# Patient Record
Sex: Male | Born: 1950 | Race: White | Marital: Married | Smoking: Former smoker
Health system: Southern US, Community
[De-identification: ages and names within clinical notes are randomized; demographics above are authoritative.]

## PROBLEM LIST (undated history)

## (undated) DIAGNOSIS — K7581 Nonalcoholic steatohepatitis (NASH): Secondary | ICD-10-CM

## (undated) DIAGNOSIS — K635 Polyp of colon: Secondary | ICD-10-CM

## (undated) DIAGNOSIS — R918 Other nonspecific abnormal finding of lung field: Secondary | ICD-10-CM

## (undated) DIAGNOSIS — R161 Splenomegaly, not elsewhere classified: Secondary | ICD-10-CM

## (undated) DIAGNOSIS — D497 Neoplasm of unspecified behavior of endocrine glands and other parts of nervous system: Secondary | ICD-10-CM

## (undated) DIAGNOSIS — N529 Male erectile dysfunction, unspecified: Secondary | ICD-10-CM

## (undated) DIAGNOSIS — E785 Hyperlipidemia, unspecified: Secondary | ICD-10-CM

## (undated) DIAGNOSIS — I700 Atherosclerosis of aorta: Secondary | ICD-10-CM

## (undated) DIAGNOSIS — E119 Type 2 diabetes mellitus without complications: Secondary | ICD-10-CM

## (undated) DIAGNOSIS — I10 Essential (primary) hypertension: Secondary | ICD-10-CM

## (undated) HISTORY — DX: Neoplasm of unspecified behavior of endocrine glands and other parts of nervous system: D49.7

## (undated) HISTORY — PX: KNEE SURGERY: SHX244

## (undated) HISTORY — DX: Atherosclerosis of aorta: I70.0

## (undated) HISTORY — DX: Essential (primary) hypertension: I10

## (undated) HISTORY — DX: Splenomegaly, not elsewhere classified: R16.1

## (undated) HISTORY — PX: MELANOMA EXCISION: SHX5266

## (undated) HISTORY — DX: Other nonspecific abnormal finding of lung field: R91.8

## (undated) HISTORY — DX: Male erectile dysfunction, unspecified: N52.9

## (undated) HISTORY — DX: Hyperlipidemia, unspecified: E78.5

## (undated) HISTORY — DX: Nonalcoholic steatohepatitis (NASH): K75.81

## (undated) HISTORY — DX: Type 2 diabetes mellitus without complications: E11.9

## (undated) HISTORY — DX: Polyp of colon: K63.5

---

## 2019-10-07 ENCOUNTER — Other Ambulatory Visit: Payer: Self-pay | Admitting: Internal Medicine

## 2019-10-07 DIAGNOSIS — R053 Chronic cough: Secondary | ICD-10-CM

## 2019-10-07 DIAGNOSIS — Z87891 Personal history of nicotine dependence: Secondary | ICD-10-CM

## 2019-10-15 ENCOUNTER — Ambulatory Visit: Payer: Self-pay

## 2019-10-20 ENCOUNTER — Ambulatory Visit
Admission: RE | Admit: 2019-10-20 | Discharge: 2019-10-20 | Disposition: A | Discharge status: Home / Self Care | Payer: Medicare Other | Source: Ambulatory Visit | Attending: Internal Medicine | Admitting: Internal Medicine

## 2019-10-20 ENCOUNTER — Other Ambulatory Visit: Payer: Self-pay | Admitting: Internal Medicine

## 2019-10-20 DIAGNOSIS — Z87891 Personal history of nicotine dependence: Secondary | ICD-10-CM

## 2019-10-20 DIAGNOSIS — R053 Chronic cough: Secondary | ICD-10-CM

## 2019-10-23 ENCOUNTER — Other Ambulatory Visit: Payer: Self-pay | Admitting: Internal Medicine

## 2019-10-23 DIAGNOSIS — R918 Other nonspecific abnormal finding of lung field: Secondary | ICD-10-CM

## 2019-12-20 NOTE — Progress Notes (Signed)
Cardiology Office Note   Date:  12/22/2019   ID:  [name], DOB March 23, 1950, MRN [id number]  PCP:  Deland Pretty, MD  Cardiologist:   Tekisha Darcey Martinique, MD   Chief Complaint  Patient presents with   Coronary Artery Disease      History of Present Illness: Samuel Jimenez is a 69 y.o. male who is seen at the request of Dr Shelia Media for evaluation of coronary atherosclerosis noted on CT. The patient had a CT scan of the chest in September for evaluation of a cough. He is a former smoker. CT showed calcification of the aorta as well as the coronary arteries- particularly the left main and LAD distribution. He has a history of HTN, DM type 2,  and HLD.   He denies any prior cardiac disease. He states he was evaluated by a Cardiologist in [place] several years ago and had a stress test. He denies any chest pain, Dyspnea, palpitations, edema. He is very sedentary. Does only a little light yard work. Reports myalgias on statins in the past but can't remember what was tried. Was on Vascepa at one point but now just on Zetia. He has DM for > 15 years and has some neuropathy.    Past Medical History:  Diagnosis Date   Adrenal tumor    Aortic atherosclerosis ([place])    Colon polyp    Diabetes mellitus without complication ([place])    ED (erectile dysfunction)    Hyperlipidemia    Hypertension    NASH (nonalcoholic steatohepatitis)    Pulmonary nodules    Splenomegaly     Past Surgical History:  Procedure Laterality Date   KNEE SURGERY     MELANOMA EXCISION       Current Outpatient Medications  Medication Sig Dispense Refill   ALPRAZolam (XANAX) 0.5 MG tablet Take 0.5 mg by mouth 2 (two) times daily as needed.     amLODipine (NORVASC) 10 MG tablet Take 10 mg by mouth daily.     aspirin 81 MG EC tablet Take 81 mg by mouth daily. Swallow whole.     ezetimibe (ZETIA) 10 MG tablet Take 10 mg by mouth daily.     fluticasone (CUTIVATE) 0.05 % cream SMARTSIG:1 Topical Every  Night     ketoconazole (NIZORAL) 2 % cream SMARTSIG:1 Topical Every Night     losartan-hydrochlorothiazide (HYZAAR) 50-12.5 MG tablet Take 1 tablet by mouth daily.     LUMIGAN 0.01 % SOLN      metFORMIN (GLUCOPHAGE-XR) 500 MG 24 hr tablet Take 500 mg by mouth 3 (three) times daily.     OZEMPIC, 0.25 OR 0.5 MG/DOSE, 2 MG/1.5ML SOPN Inject into the skin.     zolpidem (AMBIEN) 10 MG tablet Take 10 mg by mouth at bedtime as needed.     rosuvastatin (CRESTOR) 5 MG tablet Take 1 tablet (5 mg total) by mouth daily. 90 tablet 3   No current facility-administered medications for this visit.    Allergies:   Patient has no allergy information on record.    Social History:  The patient  reports that he quit smoking about 4 years ago. He has never used smokeless tobacco. He reports previous alcohol use. He reports previous drug use.   Family History:  The patient's family history includes Heart attack in his father; Lung cancer in his mother.    ROS:  Please see the history of present illness.   Otherwise, review of systems are positive for none.   All  other systems are reviewed and negative.    PHYSICAL EXAM: VS:  BP 125/82    Pulse 64    Temp 98.1 F (36.7 C)    Ht 6' 4"  (1.93 m)    Wt 270 lb 6.4 oz (122.7 kg)    SpO2 96%    BMI 32.91 kg/m  , BMI Body mass index is 32.91 kg/m. GEN: Well nourished, overweight, in no acute distress  HEENT: normal  Neck: no JVD, carotid bruits, or masses Cardiac: RRR; no murmurs, rubs, or gallops,no edema. Pedal pulses are 2+.   Respiratory:  clear to auscultation bilaterally, normal work of breathing GI: soft, nontender, nondistended, + BS MS: no deformity or atrophy  Skin: warm and dry, no rash Neuro:  Strength and sensation are intact Psych: euthymic mood, full affect   EKG:  EKG is ordered today. The ekg ordered today demonstrates NSR rate 64. Incomplete RBBB. I have personally reviewed and interpreted this study.    Recent Labs: No results  found for requested labs within last 8760 hours.    Lipid Panel No results found for: CHOL, TRIG, HDL, CHOLHDL, VLDL, LDLCALC, LDLDIRECT   Dated 07/01/19: A1c 6.4% Dated 09/24/19: triglycerides 220, HDL 41, LDL 108. CBC normal. Glucose 155, Creatinine 1.0. otherwise CMET normal. a1c 6.9%   Wt Readings from Last 3 Encounters:  12/22/19 270 lb 6.4 oz (122.7 kg)      Other studies Reviewed: Additional studies/ records that were reviewed today include:   CT CHEST WITHOUT CONTRAST  TECHNIQUE: Multidetector CT imaging of the chest was performed following the standard protocol without IV contrast.  COMPARISON:  None  FINDINGS: Cardiovascular: Normal heart size. No pericardial effusion. Aortic atherosclerosis. Coronary artery calcifications.  Mediastinum/Nodes: No enlarged mediastinal or axillary lymph nodes. Thyroid gland, trachea, and esophagus demonstrate no significant findings.  Lungs/Pleura: No pleural effusion, airspace consolidation or atelectasis. Tiny right upper lobe lung nodule measures 3 mm, image 82/3. 5 mm right lower lobe lung nodule is identified, image 119/3. Faint nodule in the left lower lobe measures 3 mm, image 98/3. None  Upper Abdomen: The liver has a nodular contour and there is relative hypertrophy of the caudate and lateral segment of left lobe of liver. Imaging findings are consistent with cirrhosis. Right adrenal gland adenoma measures 0.8 cm.  Musculoskeletal: Multi level spondylosis identified within the thoracic spine. There are no acute or suspicious osseous findings identified. Chest  IMPRESSION: 1. No acute cardiopulmonary abnormalities. 2. Small nonspecific pulmonary nodules are identified measuring up to 5 mm. No follow-up needed if patient is low-risk (and has no known or suspected primary neoplasm). Non-contrast chest CT can be considered in 12 months if patient is high-risk. This recommendation follows the consensus statement:  Guidelines for Management of Incidental Pulmonary Nodules Detected on CT Images: From the Fleischner Society 2017; Radiology 2017; 284:228-243. 3. Coronary artery calcifications noted. 4. Morphologic features of the liver consistent with cirrhosis. 5. Right adrenal gland adenoma.  Aortic Atherosclerosis (ICD10-I70.0).   Electronically Signed   By: Kerby Moors M.D.   On: 10/20/2019 21:03    ASSESSMENT AND PLAN:  1. Coronary artery calcification. Noted incidentally on CT. Severe calcification of left main and LAD. No clear anginal symptoms but patient is very sedentary. Multiple cardiac risk factors. Recommend stress Myoview to make sure he doesn't have high risk anatomy. Continue ASA. Will initiate Crestor at 5 mg daily to see if he can tolerate. Stressed the importance of lifestyle modification with heart healthy diet, weight  control and regular aerobic exercise 5-7 days week with moderate exertion.  2. Aortic atherosclerosis. 3. HLD. On Zetia. Will try Crestor 5 mg daily. Titrate as tolerated. If unable to tolerate will need to consider a PCSK 9 inhibitor.  4. HTN controlled 5. 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 Right adrenal adenoma.  43. Former smoker. 9. Family history of CAD.    Current medicines are reviewed at length with the patient today.  The patient does not have concerns regarding medicines.  The following changes have been made:  See above  Labs/ tests ordered today include:   Orders Placed This Encounter  Procedures   Cardiac Stress Test: Informed Consent Details: Physician/Practitioner Attestation; Transcribe to consent form and obtain patient signature   Myocardial Perfusion Imaging   EKG 12-Lead     Disposition:   FU with me post Myoview  Signed, Chamya Hunton Martinique, MD  12/22/2019 11:52 AM    [facility] [address], [place], [place], [address] Phone [phone], Fax [phone]

## 2019-12-22 ENCOUNTER — Ambulatory Visit (INDEPENDENT_AMBULATORY_CARE_PROVIDER_SITE_OTHER): Payer: Medicare Other | Admitting: Cardiology

## 2019-12-22 ENCOUNTER — Other Ambulatory Visit: Payer: Self-pay

## 2019-12-22 ENCOUNTER — Encounter: Payer: Self-pay | Admitting: Cardiology

## 2019-12-22 VITALS — BP 125/82 | HR 64 | Temp 98.1°F | Ht 76.0 in | Wt 270.4 lb

## 2019-12-22 DIAGNOSIS — E118 Type 2 diabetes mellitus with unspecified complications: Secondary | ICD-10-CM

## 2019-12-22 DIAGNOSIS — E782 Mixed hyperlipidemia: Secondary | ICD-10-CM | POA: Diagnosis not present

## 2019-12-22 DIAGNOSIS — I10 Essential (primary) hypertension: Secondary | ICD-10-CM

## 2019-12-22 DIAGNOSIS — I2584 Coronary atherosclerosis due to calcified coronary lesion: Secondary | ICD-10-CM | POA: Diagnosis not present

## 2019-12-22 DIAGNOSIS — I2510 Atherosclerotic heart disease of native coronary artery without angina pectoris: Secondary | ICD-10-CM | POA: Diagnosis not present

## 2019-12-22 MED ORDER — ROSUVASTATIN CALCIUM 5 MG PO TABS: 5.0000 mg | ORAL_TABLET | Freq: Every day | ORAL | 3 refills | Status: DC

## 2019-12-22 NOTE — Patient Instructions (Addendum)
Start Crestor 5 mg daily  We will arrange for a Nuclear stress test ( Stress Myoview )

## 2020-01-06 ENCOUNTER — Other Ambulatory Visit (HOSPITAL_COMMUNITY)
Admission: RE | Admit: 2020-01-06 | Discharge: 2020-01-06 | Disposition: A | Discharge status: Home / Self Care | Payer: Medicare Other | Source: Ambulatory Visit | Attending: Cardiology | Admitting: Cardiology

## 2020-01-06 DIAGNOSIS — Z01812 Encounter for preprocedural laboratory examination: Secondary | ICD-10-CM | POA: Diagnosis present

## 2020-01-06 DIAGNOSIS — Z20822 Contact with and (suspected) exposure to covid-19: Secondary | ICD-10-CM | POA: Insufficient documentation

## 2020-01-06 LAB — SARS CORONAVIRUS 2 (TAT 6-24 HRS): SARS Coronavirus 2: NEGATIVE

## 2020-01-09 ENCOUNTER — Ambulatory Visit (HOSPITAL_COMMUNITY)
Admission: RE | Admit: 2020-01-09 | Discharge: 2020-01-09 | Disposition: A | Discharge status: Home / Self Care | Payer: Medicare Other | Source: Ambulatory Visit | Attending: Cardiovascular Disease | Admitting: Cardiovascular Disease

## 2020-01-09 ENCOUNTER — Other Ambulatory Visit: Payer: Self-pay

## 2020-01-09 DIAGNOSIS — I2584 Coronary atherosclerosis due to calcified coronary lesion: Secondary | ICD-10-CM | POA: Diagnosis present

## 2020-01-09 DIAGNOSIS — I2510 Atherosclerotic heart disease of native coronary artery without angina pectoris: Secondary | ICD-10-CM | POA: Diagnosis present

## 2020-01-09 LAB — MYOCARDIAL PERFUSION IMAGING
Estimated workload: 9.6 METS
Exercise duration (min): 8 min
Exercise duration (sec): 0 s
LV dias vol: 107 mL (ref 62–150)
LV sys vol: 53 mL
MPHR: 153 {beats}/min
Peak HR: 162 {beats}/min
Percent HR: 105 %
Rest HR: 57 {beats}/min
SDS: 1
SRS: 1
SSS: 2
TID: 0.8

## 2020-01-09 MED ORDER — TECHNETIUM TC 99M TETROFOSMIN IV KIT
29.8000 | PACK | Freq: Once | INTRAVENOUS | Status: AC | PRN
  Administered 2020-01-09: 29.8 via INTRAVENOUS
  Filled 2020-01-09: qty 30

## 2020-01-09 MED ORDER — TECHNETIUM TC 99M TETROFOSMIN IV KIT
10.2000 | PACK | Freq: Once | INTRAVENOUS | Status: AC | PRN
  Administered 2020-01-09: 10.2 via INTRAVENOUS
  Filled 2020-01-09: qty 11

## 2020-01-15 ENCOUNTER — Other Ambulatory Visit: Payer: Self-pay

## 2020-01-15 DIAGNOSIS — E782 Mixed hyperlipidemia: Secondary | ICD-10-CM

## 2020-01-15 DIAGNOSIS — I2510 Atherosclerotic heart disease of native coronary artery without angina pectoris: Secondary | ICD-10-CM

## 2020-01-15 NOTE — Progress Notes (Signed)
Spoke to patient stress myoview results given.Dr.Jordan advised risk factor modifications.Advised to have fasting lipid and hepatic panels in 3 months.Lab orders mailed.

## 2020-04-05 LAB — LIPID PANEL
Chol/HDL Ratio: 2.7 ratio (ref 0.0–5.0)
Cholesterol, Total: 120 mg/dL (ref 100–199)
HDL: 45 mg/dL (ref >39)
LDL Chol Calc (NIH): 49 mg/dL (ref 0–99)
Triglycerides: 155 mg/dL — ABNORMAL HIGH (ref 0–149)
VLDL Cholesterol Cal: 26 mg/dL (ref 5–40)

## 2020-04-05 LAB — HEPATIC FUNCTION PANEL
ALT: 29 IU/L (ref 0–44)
AST: 27 IU/L (ref 0–40)
Albumin: 4.7 g/dL (ref 3.8–4.8)
Alkaline Phosphatase: 28 IU/L — ABNORMAL LOW (ref 44–121)
Bilirubin Total: 0.5 mg/dL (ref 0.0–1.2)
Bilirubin, Direct: 0.15 mg/dL (ref 0.00–0.40)
Total Protein: 7.2 g/dL (ref 6.0–8.5)

## 2020-04-10 NOTE — Progress Notes (Unsigned)
Cardiology Office Note   Date:  04/14/2020   ID:  Samuel Jimenez, DOB [birth year], MRN [id number]  PCP:  Deland Pretty, MD  Cardiologist:   Peter Martinique, MD   Chief Complaint  Patient presents with  . Coronary Artery Disease      History of Present Illness: Samuel Jimenez is a 70 y.o. male who is seen at the request of Dr Shelia Media for evaluation of coronary atherosclerosis noted on CT. The patient had a CT scan of the chest in September for evaluation of a cough. He is a former smoker. CT showed calcification of the aorta as well as the coronary arteries- particularly the left main and LAD distribution. He has a history of HTN, DM type 2,  and HLD. Myoview done 01/09/20 was normal.   He denies any prior cardiac disease. He states he was evaluated by a Cardiologist in [place] several years ago and had a stress test. He denies any chest pain, Dyspnea, palpitations, edema. He is very sedentary. Does only a little light yard work. Reports myalgias on statins in the past but can't remember what was tried. Was on Vascepa at one point but now just on Zetia. He has DM for > 15 years and has some neuropathy.  On his last visit we tried him on Crestor 5 mg daily. He said the next day he developed myalgias and generalized body aches that are horrific. Last LDL 84 still is not at goal. Reports CK level checked by primary care was normal.     Past Medical History:  Diagnosis Date  . Adrenal tumor   . Aortic atherosclerosis ([place])   . Colon polyp   . Diabetes mellitus without complication ([place])   . ED (erectile dysfunction)   . Hyperlipidemia   . Hypertension   . NASH (nonalcoholic steatohepatitis)   . Pulmonary nodules   . Splenomegaly     Past Surgical History:  Procedure Laterality Date  . KNEE SURGERY    . MELANOMA EXCISION       Current Outpatient Medications  Medication Sig Dispense Refill  . ALPRAZolam (XANAX) 0.5 MG tablet Take 0.5 mg by mouth 2 (two) times daily as needed.     Marland Kitchen amLODipine (NORVASC) 10 MG tablet Take 10 mg by mouth daily.    Marland Kitchen aspirin 81 MG EC tablet Take 81 mg by mouth daily. Swallow whole.    . ezetimibe (ZETIA) 10 MG tablet Take 10 mg by mouth daily.    . fluticasone (CUTIVATE) 0.05 % cream SMARTSIG:1 Topical Every Night    . ketoconazole (NIZORAL) 2 % cream SMARTSIG:1 Topical Every Night    . losartan-hydrochlorothiazide (HYZAAR) 50-12.5 MG tablet Take 1 tablet by mouth daily.    Marland Kitchen LUMIGAN 0.01 % SOLN     . metFORMIN (GLUCOPHAGE-XR) 500 MG 24 hr tablet Take 500 mg by mouth 3 (three) times daily.    Marland Kitchen OZEMPIC, 0.25 OR 0.5 MG/DOSE, 2 MG/1.5ML SOPN Inject into the skin.    Marland Kitchen zolpidem (AMBIEN) 10 MG tablet Take 10 mg by mouth at bedtime as needed.     No current facility-administered medications for this visit.    Allergies:   Patient has no allergy information on record.    Social History:  The patient  reports that he quit smoking about 4 years ago. He has never used smokeless tobacco. He reports previous alcohol use. He reports previous drug use.   Family History:  The patient's family history includes Heart attack in  his father; Lung cancer in his mother.    ROS:  Please see the history of present illness.   Otherwise, review of systems are positive for none.   All other systems are reviewed and negative.    PHYSICAL EXAM: VS:  BP 135/82   Pulse 93   Ht 6' 4"  (1.93 m)   Wt 256 lb 9.6 oz (116.4 kg)   SpO2 95%   BMI 31.23 kg/m  , BMI Body mass index is 31.23 kg/m. GEN: Well nourished, overweight, in no acute distress  HEENT: normal  Neck: no JVD, carotid bruits, or masses Cardiac: RRR; no murmurs, rubs, or gallops,no edema. Pedal pulses are 2+.   Respiratory:  clear to auscultation bilaterally, normal work of breathing GI: soft, nontender, nondistended, + BS MS: no deformity or atrophy  Skin: warm and dry, no rash Neuro:  Strength and sensation are intact Psych: euthymic mood, full affect   EKG:  EKG is not ordered  today.  Recent Labs: 04/05/2020: ALT 29    Lipid Panel    Component Value Date/Time   CHOL 120 04/05/2020 0935   TRIG 155 (H) 04/05/2020 0935   HDL 45 04/05/2020 0935   CHOLHDL 2.7 04/05/2020 0935   LDLCALC 49 04/05/2020 0935     Dated 07/01/19: A1c 6.4% Dated 09/24/19: triglycerides 220, HDL 41, LDL 108. CBC normal. Glucose 155, Creatinine 1.0. otherwise CMET normal. a1c 6.9%  Dated 03/24/20: CBC normal. Cholesterol 169, triglycerides 217, HDL 42, LDL 84. A1c 6.4%. CMET normal. PSA normal.   Wt Readings from Last 3 Encounters:  04/14/20 256 lb 9.6 oz (116.4 kg)  01/09/20 270 lb (122.5 kg)  12/22/19 270 lb 6.4 oz (122.7 kg)      Other studies Reviewed: Additional studies/ records that were reviewed today include:   CT CHEST WITHOUT CONTRAST  TECHNIQUE: Multidetector CT imaging of the chest was performed following the standard protocol without IV contrast.  COMPARISON:  None  FINDINGS: Cardiovascular: Normal heart size. No pericardial effusion. Aortic atherosclerosis. Coronary artery calcifications.  Mediastinum/Nodes: No enlarged mediastinal or axillary lymph nodes. Thyroid gland, trachea, and esophagus demonstrate no significant findings.  Lungs/Pleura: No pleural effusion, airspace consolidation or atelectasis. Tiny right upper lobe lung nodule measures 3 mm, image 82/3. 5 mm right lower lobe lung nodule is identified, image 119/3. Faint nodule in the left lower lobe measures 3 mm, image 98/3. None  Upper Abdomen: The liver has a nodular contour and there is relative hypertrophy of the caudate and lateral segment of left lobe of liver. Imaging findings are consistent with cirrhosis. Right adrenal gland adenoma measures 0.8 cm.  Musculoskeletal: Multi level spondylosis identified within the thoracic spine. There are no acute or suspicious osseous findings identified. Chest  IMPRESSION: 1. No acute cardiopulmonary abnormalities. 2. Small nonspecific  pulmonary nodules are identified measuring up to 5 mm. No follow-up needed if patient is low-risk (and has no known or suspected primary neoplasm). Non-contrast chest CT can be considered in 12 months if patient is high-risk. This recommendation follows the consensus statement: Guidelines for Management of Incidental Pulmonary Nodules Detected on CT Images: From the Fleischner Society 2017; Radiology 2017; 284:228-243. 3. Coronary artery calcifications noted. 4. Morphologic features of the liver consistent with cirrhosis. 5. Right adrenal gland adenoma.  Aortic Atherosclerosis (ICD10-I70.0).   Electronically Signed   By: Kerby Moors M.D.   On: 10/20/2019 21:03  Myoview 01/09/20: Study Highlights    Nuclear stress EF: 51%.  The left ventricular ejection fraction is  mildly decreased (45-54%).  Blood pressure demonstrated a hypertensive response to exercise.  There was no ST segment deviation noted during stress.  The study is normal.  This is a low risk study.   Normal resting and stress perfusion. No ischemia or infarction EF 51% but appears normal Baseline ECG RBBB HTN response to exercise Normal ETT with no ischemia    ASSESSMENT AND PLAN:  1. Coronary artery calcification. Noted incidentally on CT. Severe calcification of left main and LAD. No clear anginal symptoms but patient is very sedentary. Multiple cardiac risk factors. Stress Myoview was fortunately normal. Focus on risk factor modification. Encouraged increase aerobic activity. 2. Aortic atherosclerosis. 3. HLD. On Zetia. Intolerant of low dose Crestor 5 mg daily. Recommend he discontinue this. Will refer to lipid clinic to consider a PCSK 9 inhibitor vs Nexlitol.  4. HTN controlled 5. 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 Right adrenal adenoma.  8. Former smoker. 9. Family history of CAD.  Current medicines are reviewed at length with the patient today.  The patient does  not have concerns regarding medicines.  The following changes have been made:  See above  Labs/ tests ordered today include:   No orders of the defined types were placed in this encounter.    Disposition:   FU with me 6 months   Signed, Peter Martinique, MD  04/14/2020 9:56 AM    [facility] [address], [place], [place], [address] Phone [phone], Fax [phone]

## 2020-04-14 ENCOUNTER — Other Ambulatory Visit: Payer: Self-pay

## 2020-04-14 ENCOUNTER — Ambulatory Visit (INDEPENDENT_AMBULATORY_CARE_PROVIDER_SITE_OTHER): Payer: Medicare Other | Admitting: Cardiology

## 2020-04-14 ENCOUNTER — Encounter: Payer: Self-pay | Admitting: Cardiology

## 2020-04-14 VITALS — BP 135/82 | HR 93 | Ht 76.0 in | Wt 256.6 lb

## 2020-04-14 DIAGNOSIS — I10 Essential (primary) hypertension: Secondary | ICD-10-CM | POA: Diagnosis not present

## 2020-04-14 DIAGNOSIS — I2510 Atherosclerotic heart disease of native coronary artery without angina pectoris: Secondary | ICD-10-CM | POA: Diagnosis not present

## 2020-04-14 DIAGNOSIS — I2584 Coronary atherosclerosis due to calcified coronary lesion: Secondary | ICD-10-CM

## 2020-04-14 DIAGNOSIS — E118 Type 2 diabetes mellitus with unspecified complications: Secondary | ICD-10-CM | POA: Diagnosis not present

## 2020-04-14 DIAGNOSIS — E782 Mixed hyperlipidemia: Secondary | ICD-10-CM | POA: Diagnosis not present

## 2020-04-14 NOTE — Patient Instructions (Signed)
Medication Instructions:  Continue same medications *If you need a refill on your cardiac medications before your next appointment, please call your pharmacy*   Lab Work: None ordered   Testing/Procedures: None ordered   Follow-Up: At [facility], you and your health needs are our priority.  As part of our continuing mission to provide you with exceptional heart care, we have created designated Provider Care Teams.  These Care Teams include your primary Cardiologist (physician) and Advanced Practice Providers (APPs -  Physician Assistants and Nurse Practitioners) who all work together to provide you with the care you need, when you need it.  We recommend signing up for the patient portal called "MyChart".  Sign up information is provided on this After Visit Summary.  MyChart is used to connect with patients for Virtual Visits (Telemedicine).  Patients are able to view lab/test results, encounter notes, upcoming appointments, etc.  Non-urgent messages can be sent to your provider as well.   To learn more about what you can do with MyChart, go to [email or web address].    Your next appointment: 6 months   Call in July to schedule Sept appointment     The format for your next appointment:  Office     Provider: Dr.Jordan   Schedule appointment with [facility]

## 2020-05-11 ENCOUNTER — Other Ambulatory Visit: Payer: Self-pay

## 2020-05-11 ENCOUNTER — Ambulatory Visit (INDEPENDENT_AMBULATORY_CARE_PROVIDER_SITE_OTHER): Payer: Medicare Other | Admitting: Pharmacist

## 2020-05-11 VITALS — BP 152/84 | HR 73 | Resp 16 | Ht 76.0 in | Wt 269.2 lb

## 2020-05-11 DIAGNOSIS — G720 Drug-induced myopathy: Secondary | ICD-10-CM | POA: Diagnosis not present

## 2020-05-11 DIAGNOSIS — E785 Hyperlipidemia, unspecified: Secondary | ICD-10-CM | POA: Diagnosis not present

## 2020-05-11 DIAGNOSIS — K7581 Nonalcoholic steatohepatitis (NASH): Secondary | ICD-10-CM

## 2020-05-11 DIAGNOSIS — Z79899 Other long term (current) drug therapy: Secondary | ICD-10-CM | POA: Diagnosis not present

## 2020-05-11 DIAGNOSIS — I2510 Atherosclerotic heart disease of native coronary artery without angina pectoris: Secondary | ICD-10-CM

## 2020-05-11 DIAGNOSIS — T466X5A Adverse effect of antihyperlipidemic and antiarteriosclerotic drugs, initial encounter: Secondary | ICD-10-CM

## 2020-05-11 DIAGNOSIS — I2584 Coronary atherosclerosis due to calcified coronary lesion: Secondary | ICD-10-CM

## 2020-05-11 NOTE — Patient Instructions (Addendum)
Your Results:             Your most recent labs Goal  Total Cholesterol 120 < 200  Triglycerides 155 < 150  HDL (good cholesterol) 45 > 40  LDL (bad cholesterol) 49 < 70      Medication changes: *Continue taking ezetimibe 83m daily  *Other Cholesterol management options:   PCSK9i injections (Repatha/Praluent) - will determine need after fasting blood work completed    Clinic phone number: [phone](Caeson Filippi/Kristin/Haliegh)  Lab orders:  Repeat fasting in 7-10 days for baseline results  Patient Assistance:  The [employer] foundation offers assistance to help pay for medication copays.  They will cover copays for all cholesterol lowering meds, including statins, fibrates, omega-3 oils, ezetimibe, Repatha, Praluent, Nexletol, Nexlizet.  The cards are usually good for $2,500 or 12 months, whichever comes first. 1. Go to healthwellfoundation.org 2. Click on "Apply Now" 3. Answer questions as to whom is applying (patient or representative) 4. Your disease fund will be "hypercholesterolemia - Medicare access" 5. They will ask questions about finances and which medications you are taking for cholesterol 6. When you submit, the approval is usually within minutes.  You will need to print the card information from the site 7. You will need to show this information to your pharmacy, they will bill your Medicare Part D plan first -then bill [employer] Well --for the copay.   You can also call them at [phone] although the hold times can be quite long.   Thank you for choosing [facility]

## 2020-05-11 NOTE — Progress Notes (Signed)
Patient ID: Samuel Jimenez                 DOB: 13-Aug-1950                    MRN: [id number]     HPI: Samuel Jimenez is a 70 y.o. male patient referred to lipid clinic by Dr. Martinique. PMH is significant for hypertension, diabetes, hyperlipidemia, NASH (diagnosed 4-5 years ago), splenomegaly, family hx of CAD, and aortic calcification per CT reports (aortic atherosclerosis). Chronic back and legs pain worsen with statin.   Current Medications: Ezetimibe 68m daily   Intolerances:  Atorvastatin - per history - severe muscle pain Rosuvastatin 575mdaily - severe muscle pain Vascepa - ??  LDL goal: <7022mL  Diet: cut out all alcohol, eat more vegetables, decreased fat as much as possible, mostly home cooked meals.  Exercise: activities of daily living, light yard work, 1 mile walks evert 2-3 days  Family History: family history includes Heart attack in his father; Lung cancer in his mother.   Social History: he quit smoking about 4 years ago. He has never used smokeless tobacco. He reports previous alcohol use. He reports previous drug use.  Labs: 04/05/2020: CHO 120, TG 155, HDL 45, LDL-c 49 (rosuvastatin 5mg26mus ezetimibe 10mg77mast Medical History:  Diagnosis Date  . Adrenal tumor   . Aortic atherosclerosis ([facility]) [place] Colon polyp   . Diabetes mellitus without complication ([facility]) [place] ED (erectile dysfunction)   . Hyperlipidemia   . Hypertension   . NASH (nonalcoholic steatohepatitis)   . Pulmonary nodules   . Splenomegaly     Current Outpatient Medications on File Prior to Visit  Medication Sig Dispense Refill  . ALPRAZolam (XANAX) 0.5 MG tablet Take 0.5 mg by mouth 2 (two) times daily as needed.    . amLMarland KitchenDipine (NORVASC) 10 MG tablet Take 10 mg by mouth daily.    . aspMarland Kitchenrin 81 MG EC tablet Take 81 mg by mouth daily. Swallow whole.    . ezetimibe (ZETIA) 10 MG tablet Take 10 mg by mouth daily.    . losMarland Kitchenrtan-hydrochlorothiazide (HYZAAR) 50-12.5 MG tablet Take 1 tablet by mouth  daily.    . LUMMarland KitchenGAN 0.01 % SOLN     . metFORMIN (GLUCOPHAGE-XR) 500 MG 24 hr tablet Take 500 mg by mouth 3 (three) times daily.    . Multiple Vitamins-Minerals (MULTIVITAMIN WITH MINERALS) tablet Take 1 tablet by mouth daily.    . OZEMarland KitchenPIC, 0.25 OR 0.5 MG/DOSE, 2 MG/1.5ML SOPN Inject into the skin.    . timMarland Kitchenlol (BETIMOL) 0.25 % ophthalmic solution 1-2 drops 2 (two) times daily.    . vitamin E 1000 UNIT capsule Take 1,000 Units by mouth daily.    . zolMarland Kitchenidem (AMBIEN) 10 MG tablet Take 10 mg by mouth at bedtime as needed.    . fluticasone (CUTIVATE) 0.05 % cream SMARTSIG:1 Topical Every Night    . ketoconazole (NIZORAL) 2 % cream SMARTSIG:1 Topical Every Night     No current facility-administered medications on file prior to visit.    Allergies  Allergen Reactions  . Crestor [Rosuvastatin]     myalgias  . Lipitor [Atorvastatin]     myalgias    Hyperlipidemia LDL goal <70 Most recent LDL at desired goal for secondary prevention but patient stopped rosuvastatin therapy due to increase back and leg pain. Noted history of NASH and diabetes as well. Patient remains on ezetimibe 10mg 59mtherapy with no problems. He stop  all alcohol and decreased fatty food intake as well. Exercises as much as possible , but suffers from chronic back and leg pain.   We discussed Repatha/Praluent therapy including MOA, prior-authorization process, administration storage, common side effects, morning, and financial assistance available.  Will repeat fasting blood work in 7-10 days (4 weeks after stopping rosuvastatin). Plan to start PA process for Repatha/Praluent if LDL above 17m/dL.   Korie Brabson Rodriguez-Guzman PharmD, BCPS, CPort [id number]/18/2022 4:50 PM

## 2020-05-17 ENCOUNTER — Encounter: Payer: Self-pay | Admitting: Pharmacist

## 2020-05-17 DIAGNOSIS — E785 Hyperlipidemia, unspecified: Secondary | ICD-10-CM | POA: Insufficient documentation

## 2020-05-17 DIAGNOSIS — T466X5A Adverse effect of antihyperlipidemic and antiarteriosclerotic drugs, initial encounter: Secondary | ICD-10-CM | POA: Insufficient documentation

## 2020-05-17 DIAGNOSIS — G720 Drug-induced myopathy: Secondary | ICD-10-CM | POA: Insufficient documentation

## 2020-05-17 DIAGNOSIS — K7581 Nonalcoholic steatohepatitis (NASH): Secondary | ICD-10-CM | POA: Insufficient documentation

## 2020-05-17 NOTE — Assessment & Plan Note (Signed)
Most recent LDL at desired goal for secondary prevention but patient stopped rosuvastatin therapy due to increase back and leg pain. Noted history of NASH and diabetes as well. Patient remains on ezetimibe 30m monotherapy with no problems. He stop all alcohol and decreased fatty food intake as well. Exercises as much as possible , but suffers from chronic back and leg pain.   We discussed Repatha/Praluent therapy including MOA, prior-authorization process, administration storage, common side effects, morning, and financial assistance available.  Will repeat fasting blood work in 7-10 days (4 weeks after stopping rosuvastatin). Plan to start PA process for Repatha/Praluent if LDL above 781mdL.

## 2020-05-20 ENCOUNTER — Telehealth: Payer: Self-pay

## 2020-05-20 DIAGNOSIS — E785 Hyperlipidemia, unspecified: Secondary | ICD-10-CM

## 2020-05-20 LAB — LIPID PANEL
Chol/HDL Ratio: 5.4 ratio — ABNORMAL HIGH (ref 0.0–5.0)
Cholesterol, Total: 193 mg/dL (ref 100–199)
HDL: 36 mg/dL — ABNORMAL LOW (ref >39)
LDL Chol Calc (NIH): 110 mg/dL — ABNORMAL HIGH (ref 0–99)
Triglycerides: 273 mg/dL — ABNORMAL HIGH (ref 0–149)
VLDL Cholesterol Cal: 47 mg/dL — ABNORMAL HIGH (ref 5–40)

## 2020-05-20 MED ORDER — REPATHA SURECLICK 140 MG/ML ~~LOC~~ SOAJ: 140.0000 mg | SUBCUTANEOUS | 11 refills | Status: DC

## 2020-05-20 MED ORDER — PRALUENT 150 MG/ML ~~LOC~~ SOAJ: 150.0000 mg | SUBCUTANEOUS | 11 refills | Status: DC

## 2020-05-20 NOTE — Telephone Encounter (Signed)
Called and spoke w/pt regarding the approval of the praluent, rx sent and pt voiced understanding to complete fasting labs after 4th dose and no appointment needed. healthwell information emailed to pt and called into the pharmacy. Pt voiced gratitude and understanding

## 2020-05-21 ENCOUNTER — Telehealth: Payer: Self-pay

## 2020-05-21 NOTE — Telephone Encounter (Signed)
Transition Care Management Unsuccessful Follow-up Telephone Call  Date of discharge and from where:  05/20/2020 from [facility]  Attempts:  1st Attempt  Reason for unsuccessful TCM follow-up call:  Left voice message

## 2020-05-24 NOTE — Telephone Encounter (Signed)
Transition Care Management Follow-up Telephone Call  Date of discharge and from where: 05/20/2020 from [facility]  How have you been since you were released from the hospital? Pt states that he is feeling well.   Any questions or concerns? No  Items Reviewed:  Did the pt receive and understand the discharge instructions provided? Yes   Medications obtained and verified? Yes   Other? No   Any new allergies since your discharge? No   Dietary orders reviewed? N/a  Do you have support at home? Yes   Functional Questionnaire: (I = Independent and D = Dependent) ADLs: I  Bathing/Dressing- I  Meal Prep- I  Eating- I  Maintaining continence- I  Transferring/Ambulation- I  Managing Meds- I   Follow up appointments reviewed:   PCP Hospital f/u appt confirmed? No    Specialist Hospital f/u appt confirmed? No  .  Are transportation arrangements needed? No   If their condition worsens, is the pt aware to call PCP or go to the Emergency Dept.? Yes  Was the patient provided with contact information for the PCP's office or ED? Yes  Was to pt encouraged to call back with questions or concerns? Yes

## 2020-07-09 LAB — HEPATIC FUNCTION PANEL
ALT: 30 IU/L (ref 0–44)
AST: 26 IU/L (ref 0–40)
Albumin: 4.3 g/dL (ref 3.8–4.8)
Alkaline Phosphatase: 32 IU/L — ABNORMAL LOW (ref 44–121)
Bilirubin Total: 0.4 mg/dL (ref 0.0–1.2)
Bilirubin, Direct: 0.15 mg/dL (ref 0.00–0.40)
Total Protein: 7 g/dL (ref 6.0–8.5)

## 2020-07-09 LAB — LIPID PANEL
Chol/HDL Ratio: 2.8 ratio (ref 0.0–5.0)
Cholesterol, Total: 121 mg/dL (ref 100–199)
HDL: 43 mg/dL (ref >39)
LDL Chol Calc (NIH): 40 mg/dL (ref 0–99)
Triglycerides: 247 mg/dL — ABNORMAL HIGH (ref 0–149)
VLDL Cholesterol Cal: 38 mg/dL (ref 5–40)

## 2020-07-12 NOTE — Telephone Encounter (Signed)
Liver function test remain within normal limits. LDL (bad cholesterol) great at 86m/dL.  Triglycerides continue to be elevated. Keep simple sugars, pasta, bread and beer to minimum.  Will benefit from adding Vascepa or Lovaza to current therapy.

## 2020-07-12 NOTE — Addendum Note (Signed)
Addended by: Allean Found on: 07/12/2020 08:54 AM   Modules accepted: Orders

## 2020-07-12 NOTE — Telephone Encounter (Signed)
Called and spoke w/pt and results given to pt and they stated that they would like to make the changes in their diet first and re-evaluate in 3 months and they voiced understanding

## 2020-09-30 ENCOUNTER — Other Ambulatory Visit: Payer: Self-pay | Admitting: Internal Medicine

## 2020-09-30 DIAGNOSIS — R918 Other nonspecific abnormal finding of lung field: Secondary | ICD-10-CM

## 2020-10-29 ENCOUNTER — Ambulatory Visit: Payer: Medicare Other | Admitting: Cardiology

## 2020-11-04 ENCOUNTER — Other Ambulatory Visit: Payer: Self-pay

## 2020-11-04 ENCOUNTER — Ambulatory Visit
Admission: RE | Admit: 2020-11-04 | Discharge: 2020-11-04 | Disposition: A | Discharge status: Home / Self Care | Payer: Medicare Other | Source: Ambulatory Visit | Attending: Internal Medicine | Admitting: Internal Medicine

## 2020-11-04 DIAGNOSIS — R918 Other nonspecific abnormal finding of lung field: Secondary | ICD-10-CM

## 2021-03-10 NOTE — Progress Notes (Signed)
Cardiology Office Note   Date:  03/14/2021   ID:  Samuel Jimenez, DOB [birth year], MRN [id number]  PCP:  Deland Pretty, MD  Cardiologist:   Ndea Kilroy Martinique, MD   Chief Complaint  Patient presents with   Coronary Artery Disease      History of Present Illness: Samuel Jimenez is a 71 y.o. male who is seen at the request of Dr Shelia Media for evaluation of coronary atherosclerosis noted on CT. The patient had a CT scan of the chest in September for evaluation of a cough. He is a former smoker. CT showed calcification of the aorta as well as the coronary arteries- particularly the left main and LAD distribution. He has a history of HTN, DM type 2,  and HLD. Myoview done 01/09/20 was normal.   He denies any prior cardiac disease. He states he was evaluated by a Cardiologist in [place] several years ago and had a stress test.   On follow up today he denies any chest pain, dyspnea, palpitations, edema. He is  sedentary.  Reports myalgias on statins in the past. Now on Praluent. He has DM for > 15 years and has some neuropathy. Labs checked by Dr Shelia Media.     Past Medical History:  Diagnosis Date   Adrenal tumor    Aortic atherosclerosis ([place])    Colon polyp    Diabetes mellitus without complication ([place])    ED (erectile dysfunction)    Hyperlipidemia    Hypertension    NASH (nonalcoholic steatohepatitis)    Pulmonary nodules    Splenomegaly     Past Surgical History:  Procedure Laterality Date   KNEE SURGERY     MELANOMA EXCISION       Current Outpatient Medications  Medication Sig Dispense Refill   Alirocumab (PRALUENT) 150 MG/ML SOAJ Inject 150 mg into the skin every 14 (fourteen) days. 2 mL 11   ALPRAZolam (XANAX) 0.5 MG tablet Take 0.5 mg by mouth 2 (two) times daily as needed.     amLODipine (NORVASC) 10 MG tablet Take 10 mg by mouth daily.     aspirin 81 MG EC tablet Take 81 mg by mouth daily. Swallow whole.     ezetimibe (ZETIA) 10 MG tablet Take 10 mg by mouth daily.      ketoconazole (NIZORAL) 2 % cream SMARTSIG:1 Topical Every Night     losartan-hydrochlorothiazide (HYZAAR) 50-12.5 MG tablet Take 1 tablet by mouth daily.     LUMIGAN 0.01 % SOLN      metFORMIN (GLUCOPHAGE-XR) 500 MG 24 hr tablet Take 500 mg by mouth 3 (three) times daily.     Multiple Vitamins-Minerals (MULTIVITAMIN WITH MINERALS) tablet Take 1 tablet by mouth daily.     Semaglutide, 1 MG/DOSE, (OZEMPIC, 1 MG/DOSE,) 4 MG/3ML SOPN INJECT 1 MG SUBCUTANEOUS WEEKLY 28 DAYS     vitamin E 1000 UNIT capsule Take 1,000 Units by mouth daily.     No current facility-administered medications for this visit.    Allergies:   Crestor [rosuvastatin] and Lipitor [atorvastatin]    Social History:  The patient  reports that he quit smoking about 5 years ago. His smoking use included cigarettes. He has never used smokeless tobacco. He reports that he does not currently use alcohol. He reports that he does not currently use drugs.   Family History:  The patient's family history includes Heart attack in his father; Lung cancer in his mother.    ROS:  Please see the history of present illness.  Otherwise, review of systems are positive for none.   All other systems are reviewed and negative.    PHYSICAL EXAM: VS:  BP 118/68    Pulse 74    Ht 6' 4"  (1.93 m)    Wt 268 lb 6.4 oz (121.7 kg)    SpO2 95%    BMI 32.67 kg/m  , BMI Body mass index is 32.67 kg/m. GEN: Well nourished, overweight, in no acute distress  HEENT: normal  Neck: no JVD, carotid bruits, or masses Cardiac: RRR; no murmurs, rubs, or gallops,no edema. Pedal pulses are 2+.   Respiratory:  clear to auscultation bilaterally, normal work of breathing GI: soft, nontender, nondistended, + BS MS: no deformity or atrophy  Skin: warm and dry, no rash Neuro:  Strength and sensation are intact Psych: euthymic mood, full affect   EKG:  EKG is ordered today. NSR rate 74, RBBB. I have personally reviewed and interpreted this study.   Recent  Labs: 07/09/2020: ALT 30    Lipid Panel    Component Value Date/Time   CHOL 121 07/09/2020 0841   TRIG 247 (H) 07/09/2020 0841   HDL 43 07/09/2020 0841   CHOLHDL 2.8 07/09/2020 0841   LDLCALC 40 07/09/2020 0841     Dated 07/01/19: A1c 6.4% Dated 09/24/19: triglycerides 220, HDL 41, LDL 108. CBC normal. Glucose 155, Creatinine 1.0. otherwise CMET normal. a1c 6.9%  Dated 03/24/20: CBC normal. Cholesterol 169, triglycerides 217, HDL 42, LDL 84. A1c 6.4%. CMET normal. PSA normal.   Wt Readings from Last 3 Encounters:  03/14/21 268 lb 6.4 oz (121.7 kg)  05/11/20 269 lb 3.2 oz (122.1 kg)  04/14/20 256 lb 9.6 oz (116.4 kg)      Other studies Reviewed: Additional studies/ records that were reviewed today include:   CT CHEST WITHOUT CONTRAST   TECHNIQUE: Multidetector CT imaging of the chest was performed following the standard protocol without IV contrast.   COMPARISON:  None   FINDINGS: Cardiovascular: Normal heart size. No pericardial effusion. Aortic atherosclerosis. Coronary artery calcifications.   Mediastinum/Nodes: No enlarged mediastinal or axillary lymph nodes. Thyroid gland, trachea, and esophagus demonstrate no significant findings.   Lungs/Pleura: No pleural effusion, airspace consolidation or atelectasis. Tiny right upper lobe lung nodule measures 3 mm, image 82/3. 5 mm right lower lobe lung nodule is identified, image 119/3. Faint nodule in the left lower lobe measures 3 mm, image 98/3. None   Upper Abdomen: The liver has a nodular contour and there is relative hypertrophy of the caudate and lateral segment of left lobe of liver. Imaging findings are consistent with cirrhosis. Right adrenal gland adenoma measures 0.8 cm.   Musculoskeletal: Multi level spondylosis identified within the thoracic spine. There are no acute or suspicious osseous findings identified. Chest   IMPRESSION: 1. No acute cardiopulmonary abnormalities. 2. Small nonspecific pulmonary  nodules are identified measuring up to 5 mm. No follow-up needed if patient is low-risk (and has no known or suspected primary neoplasm). Non-contrast chest CT can be considered in 12 months if patient is high-risk. This recommendation follows the consensus statement: Guidelines for Management of Incidental Pulmonary Nodules Detected on CT Images: From the Fleischner Society 2017; Radiology 2017; 284:228-243. 3. Coronary artery calcifications noted. 4. Morphologic features of the liver consistent with cirrhosis. 5. Right adrenal gland adenoma.   Aortic Atherosclerosis (ICD10-I70.0).     Electronically Signed   By: Kerby Moors M.D.   On: 10/20/2019 21:03   Myoview 01/09/20: Study Highlights    Nuclear  stress EF: 51%. The left ventricular ejection fraction is mildly decreased (45-54%). Blood pressure demonstrated a hypertensive response to exercise. There was no ST segment deviation noted during stress. The study is normal. This is a low risk study.   Normal resting and stress perfusion. No ischemia or infarction EF 51% but appears normal Baseline ECG RBBB HTN response to exercise Normal ETT with no ischemia     ASSESSMENT AND PLAN:  1. Coronary artery calcification. Noted incidentally on CT. Severe calcification of left main and LAD. Asymptomatic. Multiple cardiac risk factors. Stress Myoview was fortunately normal. Focus on risk factor modification. Encouraged increase aerobic activity. 2. Aortic atherosclerosis. 3. HLD. On Zetia and Praluent. Will request copy of lab work from Dr Shelia Media.   4. HTN controlled 5. 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 Right adrenal adenoma.  25. Former smoker. 9. Family history of CAD.  Current medicines are reviewed at length with the patient today.  The patient does not have concerns regarding medicines.  The following changes have been made:  See above  Labs/ tests ordered today include:   No orders of the  defined types were placed in this encounter.    Disposition:   FU with me one year   Signed, Nihal Doan Martinique, MD  03/14/2021 8:42 AM    [facility] [address] [place], [place], [address] Phone [phone], Fax [phone]

## 2021-03-14 ENCOUNTER — Encounter: Payer: Self-pay | Admitting: Cardiology

## 2021-03-14 ENCOUNTER — Other Ambulatory Visit: Payer: Self-pay

## 2021-03-14 ENCOUNTER — Ambulatory Visit (INDEPENDENT_AMBULATORY_CARE_PROVIDER_SITE_OTHER): Payer: Medicare Other | Admitting: Cardiology

## 2021-03-14 ENCOUNTER — Telehealth: Payer: Self-pay

## 2021-03-14 VITALS — BP 118/68 | HR 74 | Ht 76.0 in | Wt 268.4 lb

## 2021-03-14 DIAGNOSIS — I10 Essential (primary) hypertension: Secondary | ICD-10-CM | POA: Diagnosis not present

## 2021-03-14 DIAGNOSIS — E785 Hyperlipidemia, unspecified: Secondary | ICD-10-CM

## 2021-03-14 DIAGNOSIS — I2510 Atherosclerotic heart disease of native coronary artery without angina pectoris: Secondary | ICD-10-CM | POA: Diagnosis not present

## 2021-03-14 DIAGNOSIS — I2584 Coronary atherosclerosis due to calcified coronary lesion: Secondary | ICD-10-CM | POA: Diagnosis not present

## 2021-03-14 DIAGNOSIS — E118 Type 2 diabetes mellitus with unspecified complications: Secondary | ICD-10-CM

## 2021-03-14 NOTE — Telephone Encounter (Signed)
Called and spoke w/pt and stated that the grant was to soon to renew and to wait closer to 3/21. Pt voiced understanding.

## 2021-03-14 NOTE — Telephone Encounter (Signed)
-----   Message from Ramond Dial, [place] sent at 03/14/2021 11:03 AM EST ----- Please renew for patient. thanks ----- Message ----- From: Luanna Salk, LPN Sent: 3/76/2831  10:21 AM EST To: [facility]  Patient saw Dr.Jordan this morning.He said his grant for [employer] is soon to expire.What does he need to do to reapply.

## 2021-04-04 ENCOUNTER — Other Ambulatory Visit: Payer: Self-pay | Admitting: Cardiology

## 2021-06-14 ENCOUNTER — Other Ambulatory Visit: Payer: Self-pay

## 2021-06-14 ENCOUNTER — Encounter (HOSPITAL_BASED_OUTPATIENT_CLINIC_OR_DEPARTMENT_OTHER): Payer: Self-pay | Admitting: Emergency Medicine

## 2021-06-14 ENCOUNTER — Emergency Department (HOSPITAL_BASED_OUTPATIENT_CLINIC_OR_DEPARTMENT_OTHER): Payer: Medicare Other

## 2021-06-14 ENCOUNTER — Ambulatory Visit
Admission: EM | Admit: 2021-06-14 | Discharge: 2021-06-14 | Disposition: A | Discharge status: Home / Self Care | Payer: Medicare Other

## 2021-06-14 ENCOUNTER — Emergency Department (HOSPITAL_BASED_OUTPATIENT_CLINIC_OR_DEPARTMENT_OTHER)
Admission: EM | Admit: 2021-06-14 | Discharge: 2021-06-14 | Disposition: A | Discharge status: Home / Self Care | Payer: Medicare Other | Attending: Emergency Medicine | Admitting: Emergency Medicine

## 2021-06-14 DIAGNOSIS — W010XXA Fall on same level from slipping, tripping and stumbling without subsequent striking against object, initial encounter: Secondary | ICD-10-CM

## 2021-06-14 DIAGNOSIS — S20212A Contusion of left front wall of thorax, initial encounter: Secondary | ICD-10-CM | POA: Diagnosis not present

## 2021-06-14 DIAGNOSIS — Z79899 Other long term (current) drug therapy: Secondary | ICD-10-CM | POA: Diagnosis not present

## 2021-06-14 DIAGNOSIS — I10 Essential (primary) hypertension: Secondary | ICD-10-CM | POA: Insufficient documentation

## 2021-06-14 DIAGNOSIS — Z7982 Long term (current) use of aspirin: Secondary | ICD-10-CM | POA: Diagnosis not present

## 2021-06-14 DIAGNOSIS — W19XXXA Unspecified fall, initial encounter: Secondary | ICD-10-CM | POA: Diagnosis not present

## 2021-06-14 DIAGNOSIS — E119 Type 2 diabetes mellitus without complications: Secondary | ICD-10-CM | POA: Insufficient documentation

## 2021-06-14 DIAGNOSIS — Z7984 Long term (current) use of oral hypoglycemic drugs: Secondary | ICD-10-CM | POA: Diagnosis not present

## 2021-06-14 DIAGNOSIS — R0781 Pleurodynia: Secondary | ICD-10-CM

## 2021-06-14 DIAGNOSIS — R161 Splenomegaly, not elsewhere classified: Secondary | ICD-10-CM

## 2021-06-14 DIAGNOSIS — S20302A Unspecified superficial injuries of left front wall of thorax, initial encounter: Secondary | ICD-10-CM | POA: Diagnosis present

## 2021-06-14 MED ORDER — OXYCODONE HCL 5 MG PO TABS: 5.0000 mg | ORAL_TABLET | Freq: Four times a day (QID) | ORAL | 0 refills | Status: DC | PRN

## 2021-06-14 MED ORDER — OXYCODONE HCL 5 MG PO TABS
5.0000 mg | ORAL_TABLET | Freq: Once | ORAL | Status: AC
  Administered 2021-06-14: 5 mg via ORAL
  Filled 2021-06-14: qty 1

## 2021-06-14 NOTE — ED Provider Notes (Signed)
Patient presents with his wife to urgent care today complaining of falling on his left side earlier today.  Patient states they were out for a walk for exercise and he stumbled a little bit.  Patient states that because they were walking on concrete he attempted to throw himself off to the left onto grass and in doing so landed on his left arm which was folded up next to his ribs.  Patient states he heard a loud crack when he hit the ground and is now having a sharp pain on the left side which is worse with inspiration.  Patient endorses a history of NASH and enlarged spleen.  Patient also history of aortic atherosclerosis.  Blood pressure is understandably elevated on arrival today.  Patient is in a mild amount of distress and appears to be in a good deal of pain at this time.  Patient advised that while I do recommend x-ray of ribs to evaluate for rib fracture fracture, I believe it would also be important to obtain CT scan of chest and abdomen to rule out acute organ injury.  Patient was agreeable to going to the emergency room now for further evaluation.  Wife states she will drive him there now. ?  ?Lynden Oxford Scales, PA-C ?06/14/21 1616 ? ?

## 2021-06-14 NOTE — ED Provider Notes (Signed)
?[facility] EMERGENCY DEPARTMENT ?Provider Note ? ? ?CSN: [id number] ?Arrival date & time: 06/14/21  1637 ? ?  ? ?History ? ?Chief Complaint  ?Patient presents with  ? Fall  ? Rib Injury  ? ? ?Samuel Jimenez is a 71 y.o. male. ? ?HPI ?71 year old male with a history of DM type II, hyperlipidemia, adrenal tumor, hypertension, Karlene Lineman, pulmonary nodules presents to the ER with concerns for pain to his left rib cage/under his left breast after having a mechanical fall this morning landing on his left side.  Patient states that he tripped and fell, no chest pain or dizziness surrounding the fall.  He caught himself but still hit his left rib cage.  He denies hitting his head.  He is on low-dose aspirin but no systemic anticoagulation.  He was seen at urgent care and sent here for further imaging and evaluation.  He denies any abdominal pain, nausea, vomiting, chest pain. ?  ? ?Home Medications ?Prior to Admission medications   ?Medication Sig Start Date End Date Taking? Authorizing Provider  ?oxyCODONE (ROXICODONE) 5 MG immediate release tablet Take 1 tablet (5 mg total) by mouth every 6 (six) hours as needed for up to 6 doses for severe pain. 06/14/21  Yes Garald Balding, PA-C  ?ALPRAZolam (XANAX) 0.5 MG tablet Take 0.5 mg by mouth 2 (two) times daily as needed. 09/01/19   [provider]  ?amLODipine (NORVASC) 10 MG tablet Take 10 mg by mouth daily. 08/23/19   [provider]  ?aspirin 81 MG EC tablet Take 81 mg by mouth daily. Swallow whole.    [provider]  ?ezetimibe (ZETIA) 10 MG tablet Take 10 mg by mouth daily. 10/13/19   [provider]  ?ketoconazole (NIZORAL) 2 % cream SMARTSIG:1 Topical Every Night 10/14/19   [provider]  ?losartan-hydrochlorothiazide (HYZAAR) 50-12.5 MG tablet Take 1 tablet by mouth daily. 09/07/19   [provider]  ?LUMIGAN 0.01 % SOLN  10/06/19   [provider]  ?metFORMIN (GLUCOPHAGE-XR) 500 MG 24 hr tablet Take 500 mg by  mouth 3 (three) times daily. 09/18/19   [provider]  ?Multiple Vitamins-Minerals (MULTIVITAMIN WITH MINERALS) tablet Take 1 tablet by mouth daily.    [provider]  ?PRALUENT 150 MG/ML SOAJ INJECT 150 MG INTO THE SKIN EVERY 14 (FOURTEEN) DAYS. 04/05/21   Martinique, Peter M, MD  ?Semaglutide, 1 MG/DOSE, (OZEMPIC, 1 MG/DOSE,) 4 MG/3ML SOPN INJECT 1 MG SUBCUTANEOUS WEEKLY 28 DAYS    [provider]  ?vitamin E 1000 UNIT capsule Take 1,000 Units by mouth daily.    [provider]  ?zolpidem (AMBIEN) 10 MG tablet Take 10 mg by mouth at bedtime as needed. 05/19/21   [provider]  ?   ? ?Allergies    ?Crestor [rosuvastatin] and Lipitor [atorvastatin]   ? ?Review of Systems   ?Review of Systems ?Ten systems reviewed and are negative for acute change, except as noted in the HPI.  ? ?Physical Exam ?Updated Vital Signs ?BP 127/82   Pulse 65   Temp 99.1 ?F (37.3 ?C) (Oral)   Resp 20   SpO2 99%  ?Physical Exam ?Chest:  ? ? ?   Comments: Mild tenderness with no step-offs or crepitus to the rib cage just under the left breast.  No overlying erythema, deformities. ?Abdominal:  ?   Comments: Abdomen is soft and nontender, no tenderness above the liver/ spleen, no overlying bruising  ?Musculoskeletal:  ?   Comments: No C,  T, L-spine tenderness.  5/5 strength in upper lower extremities bilaterally.  ? ? ?ED Results / Procedures / Treatments   ?Labs ?(all labs ordered are listed, but only abnormal results are displayed) ?Labs Reviewed - No data to display ? ?EKG ?None ? ?Radiology ?DG Ribs Unilateral W/Chest Left ? ?Result Date: 06/14/2021 ?CLINICAL DATA:  Fall.  Left rib pain EXAM: LEFT RIBS AND CHEST - 3+ VIEW COMPARISON:  None Available. FINDINGS: No fracture or other bone lesions are seen involving the ribs. There is no evidence of pneumothorax or pleural effusion. Both lungs are clear. Heart size and mediastinal contours are within normal limits. IMPRESSION: Negative.  Electronically Signed   By: Franchot Gallo M.D.   On: 06/14/2021 16:57  ? ?CT Chest Wo Contrast ? ?Result Date: 06/14/2021 ?CLINICAL DATA:  Mechanical fall, traumatic rib fracture suspected. EXAM: CT CHEST WITHOUT CONTRAST TECHNIQUE: Multidetector CT imaging of the chest was performed following the standard protocol without IV contrast. RADIATION DOSE REDUCTION: This exam was performed according to the departmental dose-optimization program which includes automated exposure control, adjustment of the mA and/or kV according to patient size and/or use of iterative reconstruction technique. COMPARISON:  Chest CT November 04, 2020 FINDINGS: Cardiovascular: Aortic atherosclerosis without aneurysmal dilation. Normal caliber central pulmonary arteries. Coronary artery calcifications. Normal size heart. No significant pericardial effusion/thickening. Mediastinum/Nodes: No suspicious thyroid nodule. No pathologically enlarged mediastinal, hilar or axillary lymph nodes, noting limited sensitivity for the detection of hilar adenopathy on this noncontrast study. Esophagus is grossly unremarkable. Lungs/Pleura: Scattered pulmonary nodules measuring up to 5 mm in the right lower lobe on image 113/3 are unchanged from prior and demonstrate 1 year of stability consistent with a benign finding. No new suspicious pulmonary nodules or masses. No pleural effusion. No pneumothorax. No focal airspace consolidation. Upper Abdomen: Nodular hepatic contour similar consistent with cirrhosis. Musculoskeletal: No acute osseous abnormality, specifically no rib fracture. Multilevel degenerative changes spine. Degenerative changes bilateral shoulders. IMPRESSION: 1. No acute osseous abnormality, specifically no rib fracture. 2. Stable small pulmonary nodules measuring up to 5 mm in the right lower lobe demonstrate 1 year of stability consistent with a benign finding. 3. Cirrhotic hepatic morphology. 4.  Aortic Atherosclerosis (ICD10-I70.0).  Electronically Signed   By: Dahlia Bailiff M.D.   On: 06/14/2021 18:36   ? ?Procedures ?Procedures  ? ? ?Medications Ordered in ED ?Medications  ?oxyCODONE (Oxy IR/ROXICODONE) immediate release tablet 5 mg (5 mg Oral Given 06/14/21 1828)  ? ? ?ED Course/ Medical Decision Making/ A&P ?  ?                        ?Medical Decision Making ?Amount and/or Complexity of Data Reviewed ?Radiology: ordered. ? ?Risk ?Prescription drug management. ? ? ?71 year old male presenting to the ER after a fall.  Overall vitals are reassuring, he is slightly hypertensive but not tachycardic, tachypneic or hypoxic.  On physical exam, he has reproducible left-sided chest wall tenderness.  No crepitus or deformities. No signs of intra-abdominal injury, no bruising, no abdominal tenderness. He has no focal neurologic deficits so suggest acute stroke as a cause of his fall. He denies hitting his head or losing consciousness.  He had no chest pain or shortness of breath surrounding the event and I have low suspicion for ACS or PE as a cause of his fall.  Fall appears to be mechanical.  X-ray without any acute left-sided rib fracture, however given significant point tenderness I did order a CT  chest without contrast to further evaluate for possible fracture given his age.  He was also given oxycodone for pain.  CT chest w/ no evidence of fractures, some stable pulmonary nodules noted which the patient and his wife are already aware of. We will send short course of oxycodone for breakthrough pain.  PDMP reviewed, appropriate.  Encouraged topical pain patches such as lidocaine patch.  We will also provide incentive spirometer, educated on appropriate use.  I encouraged close PCP follow-up.  We discussed return precautions.  He voiced understanding and is agreeable.  Stable for discharge. ? ? ?Final Clinical Impression(s) / ED Diagnoses ?Final diagnoses:  ?Fall, initial encounter  ?Contusion of rib on left side, initial encounter  ? ? ?Rx / DC  Orders ?ED Discharge Orders   ? ?      Ordered  ?  oxyCODONE (ROXICODONE) 5 MG immediate release tablet  Every 6 hours PRN       ? 06/14/21 1852  ? ?  ?  ? ?  ? ? ?  ?Garald Balding, PA-C ?06/14/21 1858 ? ?  ?Fredia Sorrow,

## 2021-06-14 NOTE — ED Triage Notes (Signed)
Pt states he had a fall today on his left side, he states he heard a cracking sound and is now having a short pain to left rib cage. Patient states the pain Is worse with inspiration.  ?

## 2021-06-14 NOTE — ED Triage Notes (Signed)
Pt reports mechanical fall this morning and landing on left side. Now c/o left sided rib pain. Movement, lifting left arm and taking deep breaths worsens pain. Denies blood thinners, loc, hitting head.  ?

## 2021-06-14 NOTE — ED Notes (Signed)
Patient is being discharged from the Urgent Care and sent to the Emergency Department via POA with spouse. Per L. Morgan-Scales PA-C ?, patient is in need of higher level of care due to need for further evaluation and imaging. Patient is aware and verbalizes understanding of plan of care.  ?Vitals:  ? 06/14/21 1606  ?BP: (!) 144/85  ?Pulse: 68  ?Resp: 18  ?Temp: 98 ?F (36.7 ?C)  ?SpO2: 95%  ?  ?

## 2021-06-14 NOTE — Discharge Instructions (Signed)
You were evaluated in the Emergency Department and after careful evaluation, we did not find any emergent condition requiring admission or further testing in the hospital. ? ?Your CT scan today did not show any evidence of rib fractures.  As discussed, there were some stable pulmonary nodules that you are aware of.  I suspect that your pain is secondary to some mild bruising.  Take oxycodone for breakthrough pain, but I would recommend using topical solutions such as Lidoderm patches as a first-line.  Make sure to use the incentive spirometer.  Please make sure to follow-up with your primary care doctor soon. ? ?Please return to the Emergency Department if you experience any worsening of your condition.   Thank you for allowing [place] to be a part of your care. ? ?

## 2021-10-19 ENCOUNTER — Other Ambulatory Visit: Payer: Self-pay | Admitting: Internal Medicine

## 2021-10-19 DIAGNOSIS — K7460 Unspecified cirrhosis of liver: Secondary | ICD-10-CM

## 2021-10-27 ENCOUNTER — Ambulatory Visit
Admission: RE | Admit: 2021-10-27 | Discharge: 2021-10-27 | Disposition: A | Discharge status: Home / Self Care | Payer: Medicare Other | Source: Ambulatory Visit | Attending: Internal Medicine | Admitting: Internal Medicine

## 2021-10-27 DIAGNOSIS — K7460 Unspecified cirrhosis of liver: Secondary | ICD-10-CM

## 2022-02-28 ENCOUNTER — Other Ambulatory Visit: Payer: Self-pay | Admitting: Cardiology

## 2022-02-28 DIAGNOSIS — I2510 Atherosclerotic heart disease of native coronary artery without angina pectoris: Secondary | ICD-10-CM

## 2022-02-28 DIAGNOSIS — E785 Hyperlipidemia, unspecified: Secondary | ICD-10-CM

## 2022-05-22 NOTE — Progress Notes (Unsigned)
Cardiology Clinic Note   Date: 05/23/2022 ID: Samuel Jimenez, DOB October 21, 1950, MRN [id number]  Primary Cardiologist:  Samuel Swaziland, MD  Patient Profile    Samuel Jimenez is a 72 y.o. male who presents to the clinic today for 1 year follow-up.   Past medical history significant for: Coronary artery calcifications. Aortic atherosclerosis. Hypertension. Hyperlipidemia. Lipid panel 02/09/2022: LDL 15, HDL 37, TG 264, total 105. T2DM. NASH.   History of Present Illness    Samuel Jimenez was first evaluated by Dr. Swaziland on 12/22/2019 for coronary atherosclerosis noted on CT at the request of Dr. Renne Crigler.  Given patient's risk factors and sedentary lifestyle nuclear stress test was ordered for further risk stratification which demonstrated hypertensive response to exercise but no ischemia.  Patient's care was focused on risk factor modification.  He is intolerant to statins secondary to myalgias.  Patient was last seen by Dr. Swaziland in the office on 03/14/2021 at that time he was doing well with no medication changes made.  Today, patient is here alone.  He reports concerns with easy fatigue that he recently noticed while doing yard work and walking a mile in his neighborhood.  Patient reports he was normally fairly active.  Last May 2023 was walking around his neighborhood caught his foot and fell bruising his ribs.  Since that time he has been overall sedentary.  Toward the end of February he began performing some yard work with his wife and started back a walking program in his neighborhood doing half mile hilly course twice a week.  He noticed upon completing the walk he is very tired.  He denies exertional chest pain, shortness of breath or DOE.  He also describes positional dizziness particularly when he is bending down (head down) gardening abruptly stands.  He denies lower extremity edema, PND.   ROS: All other systems reviewed and are otherwise negative except as noted in History of Present  Illness.  Studies Reviewed    ECG personally reviewed by me today: NSR, RBBB, 67 bpm.  No significant changes from 03/14/2021.      Physical Exam    VS:  BP 118/72 (BP Location: Left Arm, Patient Position: Sitting, Cuff Size: Normal)   Pulse 67   Ht  (1.93 m)   Wt 260 lb 9.6 oz (118.2 kg)   SpO2 97%   BMI 31.72 kg/m  , BMI Body mass index is 31.72 kg/m.  Orthostatic VS for the past 24 hrs (Last 3 readings):  BP- Lying Pulse- Lying BP- Sitting Pulse- Sitting BP- Standing at 0 minutes Pulse- Standing at 0 minutes BP- Standing at 3 minutes Pulse- Standing at 3 minutes  05/23/22 1416 114/73 60 119/80 68 120/76 65 132/86 65     GEN: Well nourished, well developed, in no acute distress. Neck: No JVD or carotid bruits. Cardiac:  RRR. No murmurs. No rubs or gallops.   Respiratory:  Respirations regular and unlabored. Clear to auscultation without rales, wheezing or rhonchi. GI: Soft, nontender, nondistended. Extremities: Radials/DP/PT 2+ and equal bilaterally. No clubbing or cyanosis. No edema.  Skin: Warm and dry, no rash. Neuro: Strength intact.  Assessment & Plan   Increased fatigue.  Patient reports since the end of February he has started doing some yard work and walking half mile course in his neighborhood.  He notices upon completion of the walk he is very tired.  Patient endorses being very sedentary since May 2023 when she fell while walking the neighborhood and bruised his ribs.  He denies associated chest pain, shortness of breath, DOE.  Symptoms sound like deconditioning.  Patient is going to continue slowly increasing physical activity.  He will contact the office if he finds he is intolerant to activity or has a decline in his tolerance to current level of activity. Positional dizziness.  Patient reports dizziness particularly bending over (head down) gardening and then abruptly stands.  Orthostatic vitals were normal.  Discussed the importance of proper body mechanics  when performing work on or near the ground.  Patient will perform gardening tasks while kneeling or sitting instead of bending over head down. Coronary artery calcification/aortic atherosclerosis.  Found on CT chest incidentally.  Normal, low risk nuclear stress test December 2021.  Patient denies chest pain, pressure, tightness.  Patient has no longer taking Zetia (see #5).  Continue aspirin, Praluent. Hypertension.  BP today 118/72.  Patient denies headaches or vision changes.  Patient describes positional dizziness (see #2).  He questions if he is on the correct dose of amlodipine.  As patient was not found to be orthostatic I feel he can continue with current dose of amlodipine.   Hyperlipidemia.  LDL January 2024 15, at goal.  Patient of PCP decided since his LDL is still low he could try going off Zetia.  Continue Praluent.  Disposition: Return in 1 year or sooner as needed.        Signed, Etta Grandchild. Manuela Halbur, DNP, NP-C

## 2022-05-23 ENCOUNTER — Encounter: Payer: Self-pay | Admitting: Student

## 2022-05-23 ENCOUNTER — Ambulatory Visit: Payer: Medicare Other | Attending: Student | Admitting: Student

## 2022-05-23 VITALS — BP 118/72 | HR 67 | Ht 76.0 in | Wt 260.6 lb

## 2022-05-23 DIAGNOSIS — I2510 Atherosclerotic heart disease of native coronary artery without angina pectoris: Secondary | ICD-10-CM | POA: Diagnosis not present

## 2022-05-23 DIAGNOSIS — I10 Essential (primary) hypertension: Secondary | ICD-10-CM | POA: Diagnosis not present

## 2022-05-23 DIAGNOSIS — I2584 Coronary atherosclerosis due to calcified coronary lesion: Secondary | ICD-10-CM | POA: Diagnosis present

## 2022-05-23 DIAGNOSIS — E785 Hyperlipidemia, unspecified: Secondary | ICD-10-CM | POA: Diagnosis present

## 2022-05-23 DIAGNOSIS — R5383 Other fatigue: Secondary | ICD-10-CM | POA: Diagnosis present

## 2022-05-23 DIAGNOSIS — R42 Dizziness and giddiness: Secondary | ICD-10-CM | POA: Insufficient documentation

## 2022-05-23 NOTE — Patient Instructions (Signed)
Medication Instructions:  Your physician recommends that you continue on your current medications as directed. Please refer to the Current Medication list given to you today.  *If you need a refill on your cardiac medications before your next appointment, please call your pharmacy*   Lab Work: NONE If you have labs (blood work) drawn today and your tests are completely normal, you will receive your results only by: MyChart Message (if you have MyChart) OR A paper copy in the mail If you have any lab test that is abnormal or we need to change your treatment, we will call you to review the results.   Testing/Procedures: NONE   Follow-Up: At [facility], you and your health needs are our priority.  As part of our continuing mission to provide you with exceptional heart care, we have created designated Provider Care Teams.  These Care Teams include your primary Cardiologist (physician) and Advanced Practice Providers (APPs -  Physician Assistants and Nurse Practitioners) who all work together to provide you with the care you need, when you need it.  We recommend signing up for the patient portal called "MyChart".  Sign up information is provided on this After Visit Summary.  MyChart is used to connect with patients for Virtual Visits (Telemedicine).  Patients are able to view lab/test results, encounter notes, upcoming appointments, etc.  Non-urgent messages can be sent to your provider as well.   To learn more about what you can do with MyChart, go to [email or web address].    Your next appointment:   1 year(s)  Provider:   Peter Jordan, MD    

## 2022-06-16 ENCOUNTER — Telehealth: Payer: Self-pay | Admitting: Cardiology

## 2022-06-16 DIAGNOSIS — R0602 Shortness of breath: Secondary | ICD-10-CM

## 2022-06-16 DIAGNOSIS — R5383 Other fatigue: Secondary | ICD-10-CM

## 2022-06-16 NOTE — Telephone Encounter (Signed)
Patient stated he is returning RN Sharon's call regarding scheduling a treadmill stress test.

## 2022-06-21 NOTE — Telephone Encounter (Signed)
Returned call to patient left message on voice  mail to call back. 

## 2022-06-23 ENCOUNTER — Other Ambulatory Visit: Payer: Self-pay | Admitting: Cardiology

## 2022-06-23 DIAGNOSIS — R0609 Other forms of dyspnea: Secondary | ICD-10-CM

## 2022-06-23 NOTE — Telephone Encounter (Signed)
Spoke to patient he stated his PCP Dr.Pharr wants him to have a stress test.Stated he has been sob and no energy.Spoke to Dr.Jordan he advised to have a stress myoview.Scheduler will call back with appointment.

## 2022-06-29 NOTE — Telephone Encounter (Signed)
Stress myoview scheduled 6/11 at 10:45 am.

## 2022-07-06 ENCOUNTER — Telehealth (HOSPITAL_COMMUNITY): Payer: Self-pay

## 2022-07-11 ENCOUNTER — Encounter (HOSPITAL_COMMUNITY): Payer: Self-pay

## 2022-07-11 ENCOUNTER — Ambulatory Visit (HOSPITAL_COMMUNITY): Payer: Medicare Other

## 2022-07-11 ENCOUNTER — Ambulatory Visit (HOSPITAL_COMMUNITY): Payer: Medicare Other | Attending: Cardiology

## 2022-07-11 ENCOUNTER — Other Ambulatory Visit (HOSPITAL_COMMUNITY): Payer: Self-pay | Admitting: Cardiology

## 2022-07-11 VITALS — Wt 260.0 lb

## 2022-07-11 DIAGNOSIS — R0602 Shortness of breath: Secondary | ICD-10-CM

## 2022-07-11 DIAGNOSIS — R0609 Other forms of dyspnea: Secondary | ICD-10-CM | POA: Insufficient documentation

## 2022-07-11 LAB — MYOCARDIAL PERFUSION IMAGING
Angina Index: 0
LV dias vol: 87 mL (ref 62–150)
LV sys vol: 36 mL
Nuc Stress EF: 59 %
Peak HR: 133 {beats}/min
Rest HR: 56 {beats}/min
Rest Nuclear Isotope Dose: 10.4 mCi
SDS: 0
SRS: 0
SSS: 0
ST Depression (mm): 0 mm
Stress Nuclear Isotope Dose: 32 mCi
TID: 0.82

## 2022-07-11 MED ORDER — TECHNETIUM TC 99M TETROFOSMIN IV KIT
10.4000 | PACK | Freq: Once | INTRAVENOUS | Status: AC | PRN
  Administered 2022-07-11: 10.4 via INTRAVENOUS

## 2022-07-11 MED ORDER — TECHNETIUM TC 99M TETROFOSMIN IV KIT
32.0000 | PACK | Freq: Once | INTRAVENOUS | Status: AC | PRN
  Administered 2022-07-11: 32 via INTRAVENOUS

## 2023-10-11 IMAGING — CT CT CHEST W/O CM
2 of 3 series · 15 of 36 positions shown, 18 images · non-contrast
Comparison: Chest CT November 04, 2020

CLINICAL DATA: Mechanical fall, traumatic rib fracture suspected.



[Series 2: thorax · axial · 0.90mm/px · z∈[-274,+86]mm · 12 of 212 slices shown, 15 images]
[im 16/212  mediastinal]
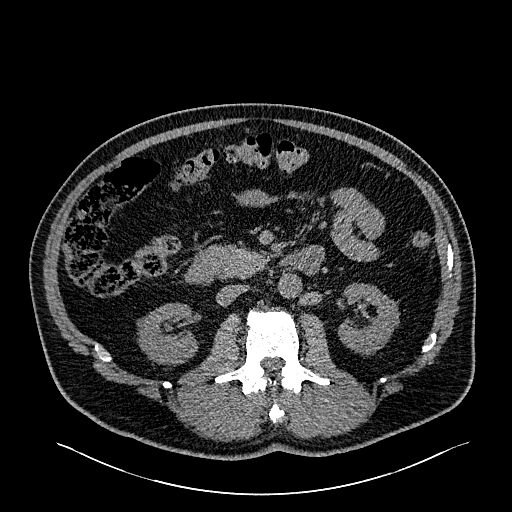
[im 16/212  lung]
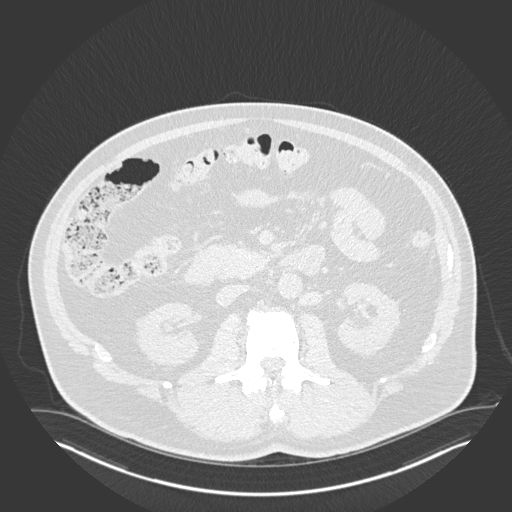
[im 32/212  lung]
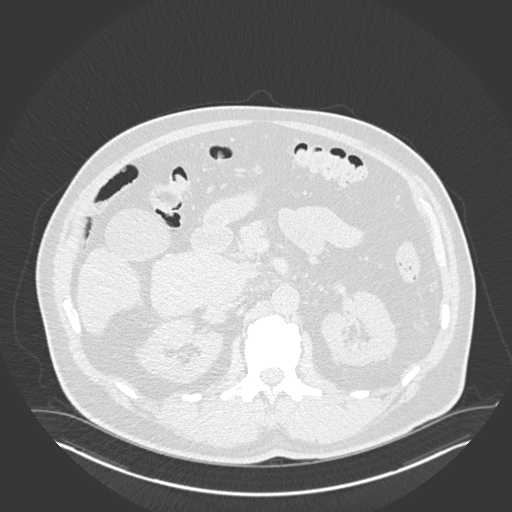
[im 47/212  lung]
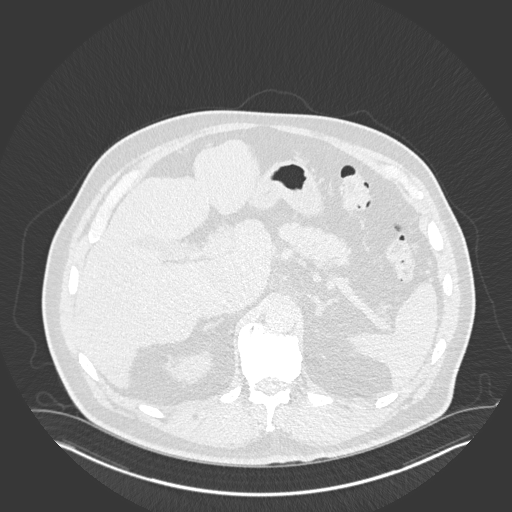
[im 63/212  lung]
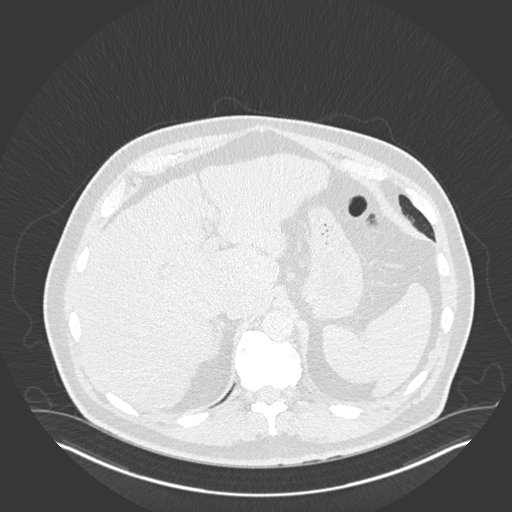
[im 79/212  mediastinal]
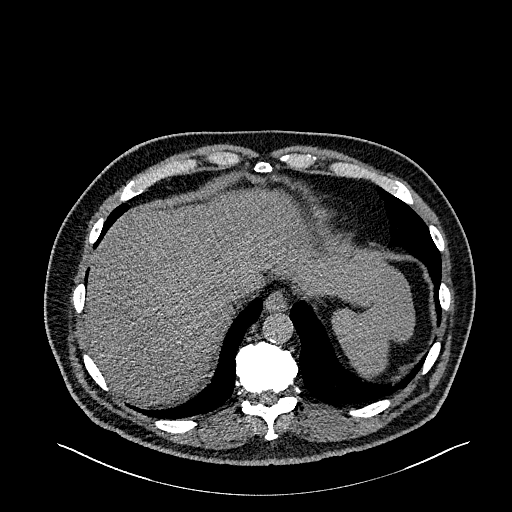
[im 79/212  lung]
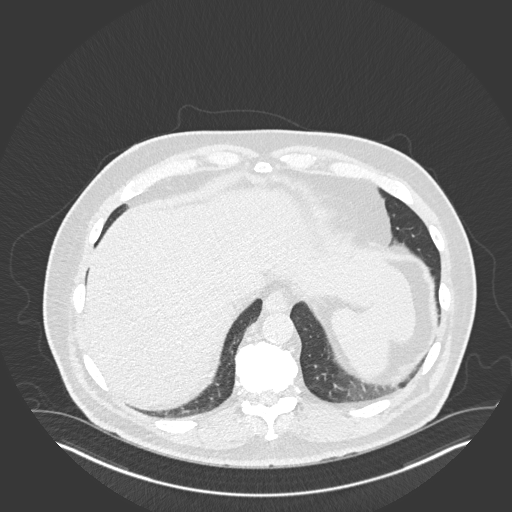
[im 94/212  lung]
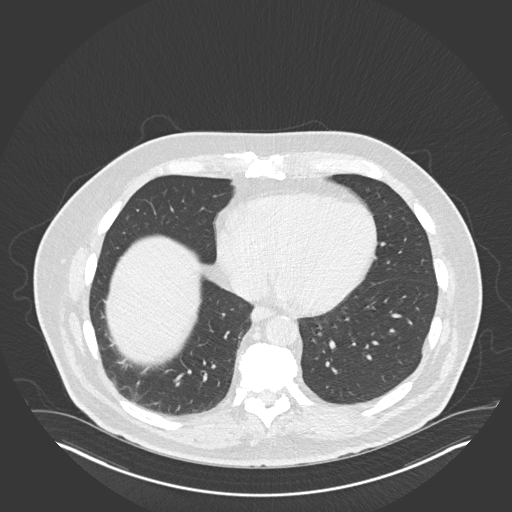
[im 118/212  lung]
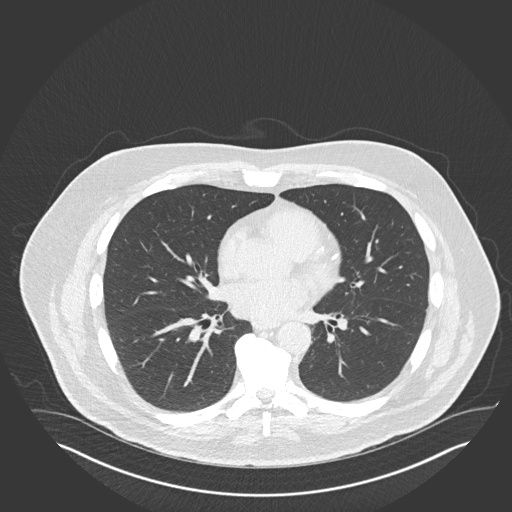
[im 133/212  lung]
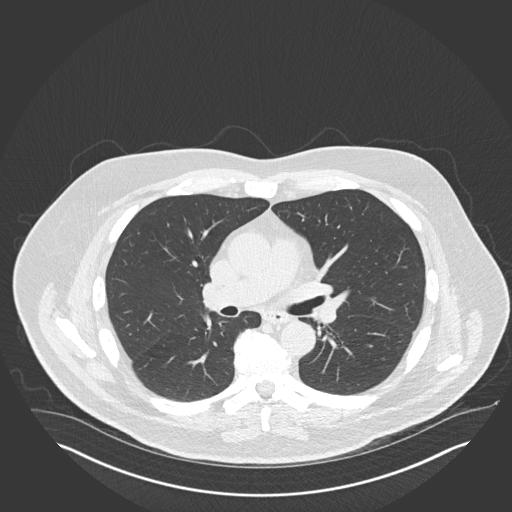
[im 149/212  mediastinal]
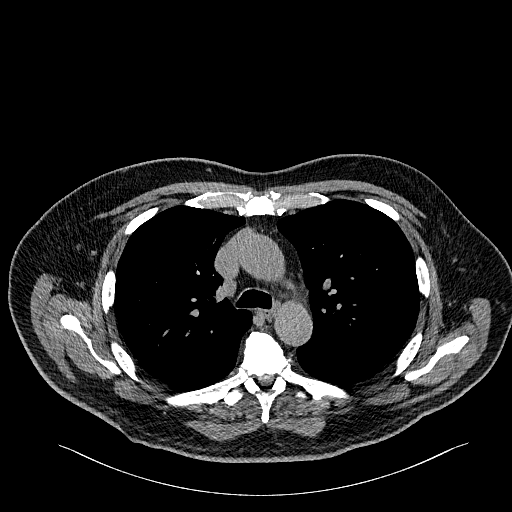
[im 149/212  lung]
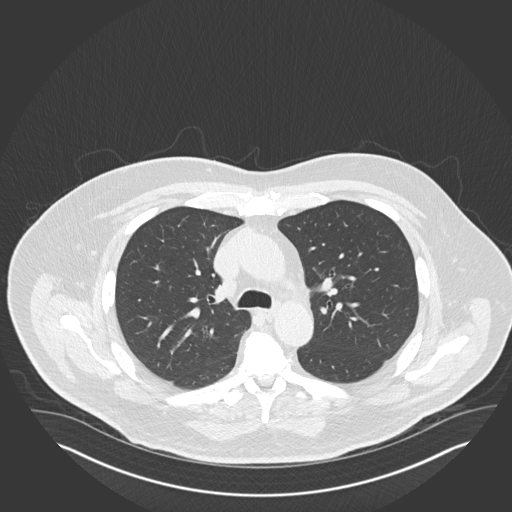
[im 165/212  lung]
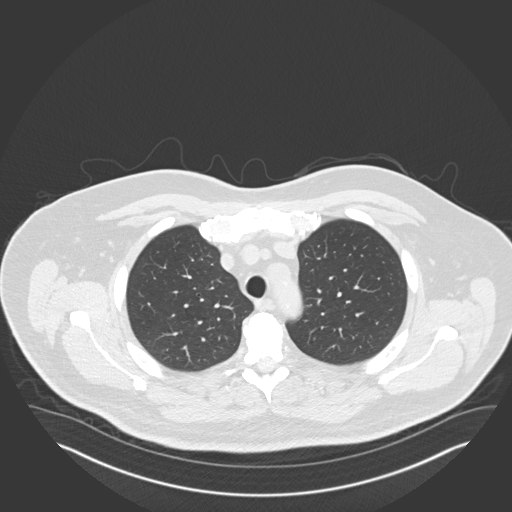
[im 180/212  lung]
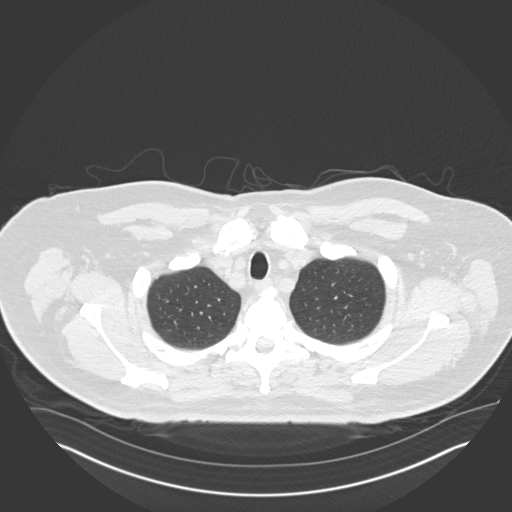
[im 196/212  lung]
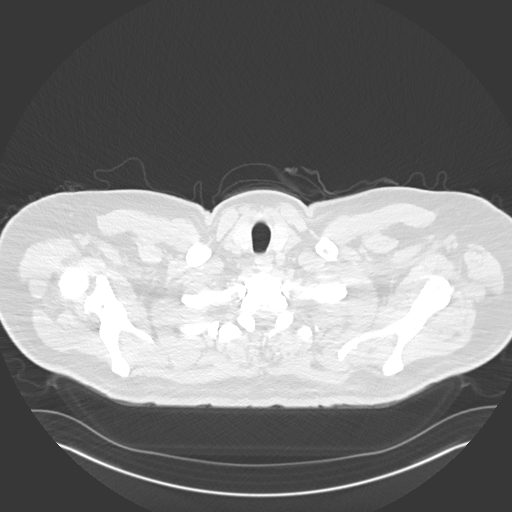

[Series 5: coronal · coronal · 0.83mm/px · 3 of 166 slices shown]
[im 34/166  lung]
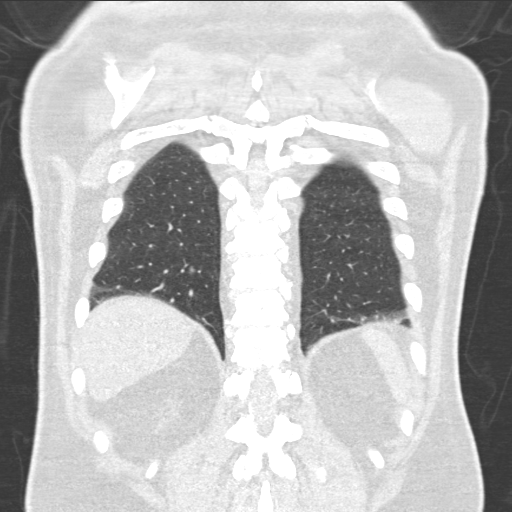
[im 67/166  lung]
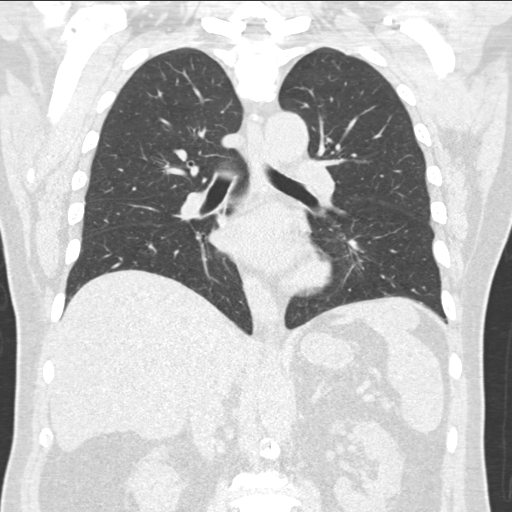
[im 100/166  lung]
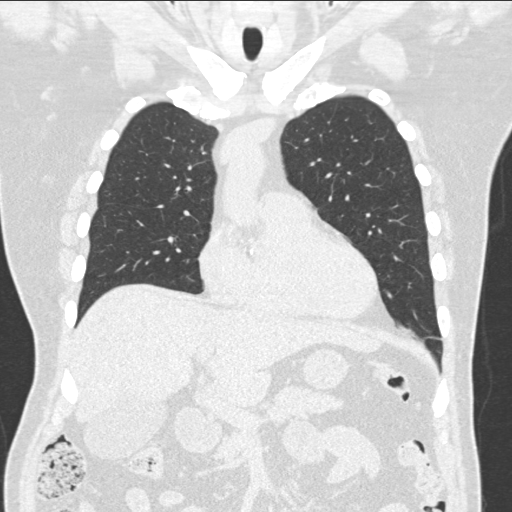

[15 of 36 positions shown; findings below may reference images not displayed]

FINDINGS: Cardiovascular: Aortic atherosclerosis without aneurysmal dilation.
Normal caliber central pulmonary arteries. Coronary artery
calcifications. Normal size heart. No significant pericardial
effusion/thickening.

Mediastinum/Nodes: No suspicious thyroid nodule. No pathologically
enlarged mediastinal, hilar or axillary lymph nodes, noting limited
sensitivity for the detection of hilar adenopathy on this
noncontrast study. Esophagus is grossly unremarkable.

Lungs/Pleura: Scattered pulmonary nodules measuring up to 5 mm in
the right lower lobe on image 113/3 are unchanged from prior and
demonstrate 1 year of stability consistent with a benign finding. No
new suspicious pulmonary nodules or masses. No pleural effusion. No
pneumothorax. No focal airspace consolidation.

Upper Abdomen: Nodular hepatic contour similar consistent with
cirrhosis.

Musculoskeletal: No acute osseous abnormality, specifically no rib
fracture. Multilevel degenerative changes spine. Degenerative
changes bilateral shoulders.
IMPRESSION: 1. No acute osseous abnormality, specifically no rib fracture.
2. Stable small pulmonary nodules measuring up to 5 mm in the right
lower lobe demonstrate 1 year of stability consistent with a benign
finding.
3. Cirrhotic hepatic morphology.
4.  Aortic Atherosclerosis ([id number]).

## 2023-10-11 IMAGING — CR DG RIBS W/ CHEST 3+V*L*
3 series · 3 of 3 positions shown · non-contrast
Comparison: None Available.

CLINICAL DATA: Fall.  Left rib pain

EXAM:
LEFT RIBS AND CHEST - 3+ VIEW

[w chest pa]
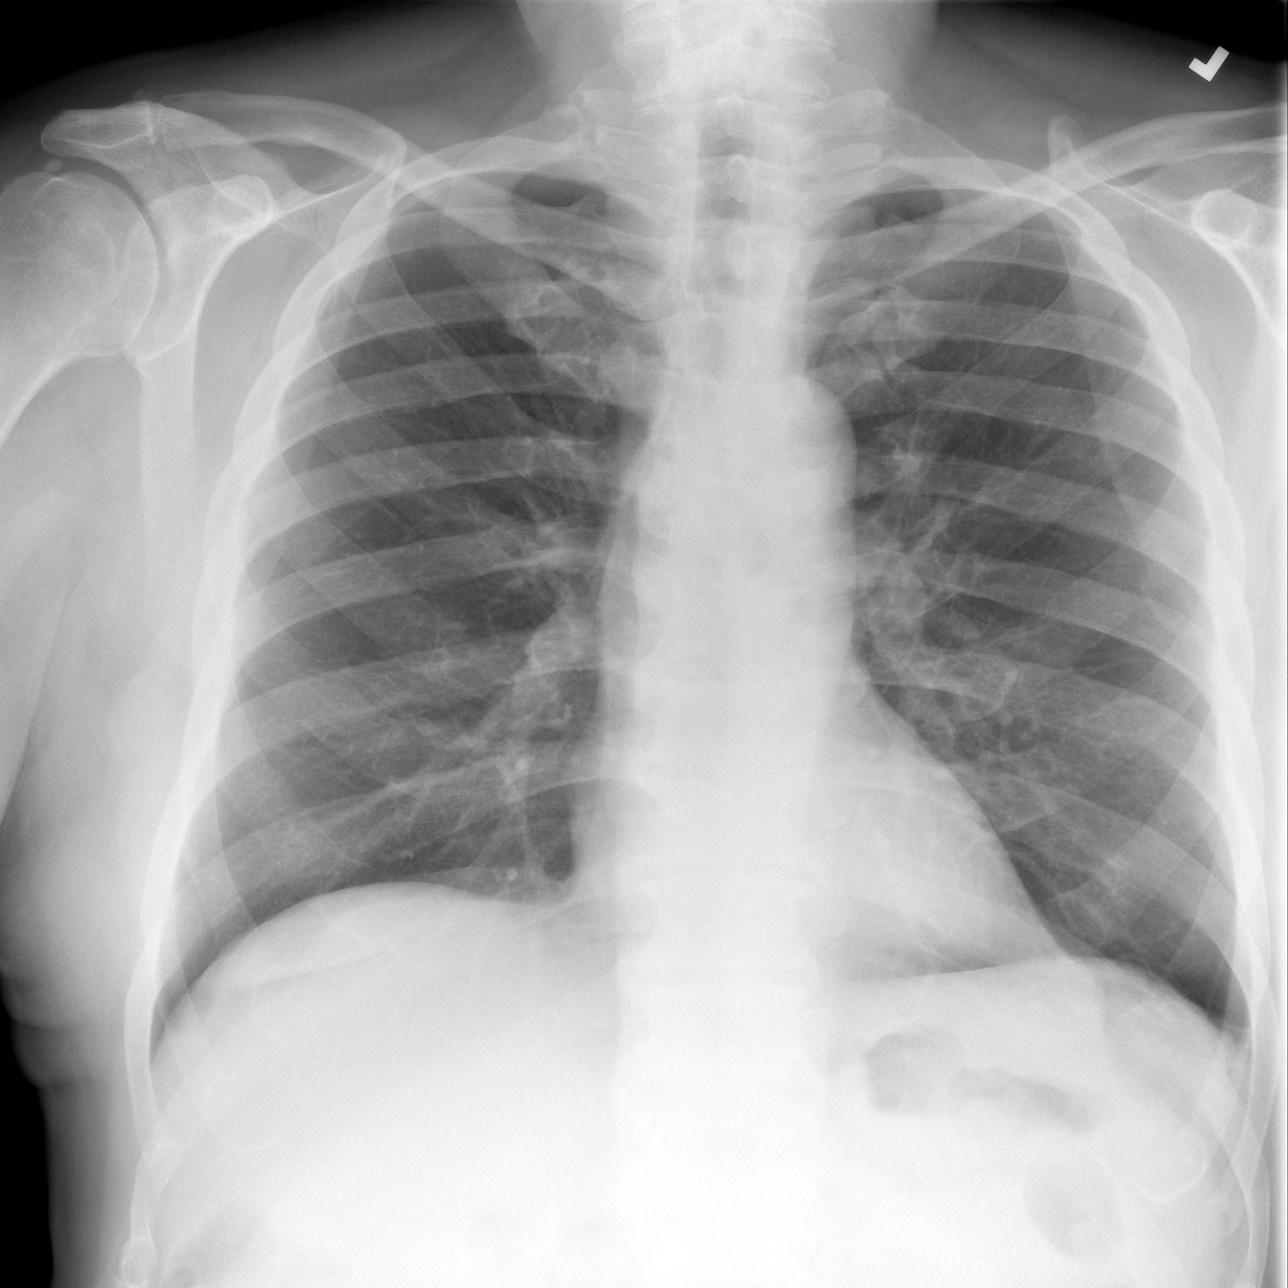

[w ribs ap/pa upper left]
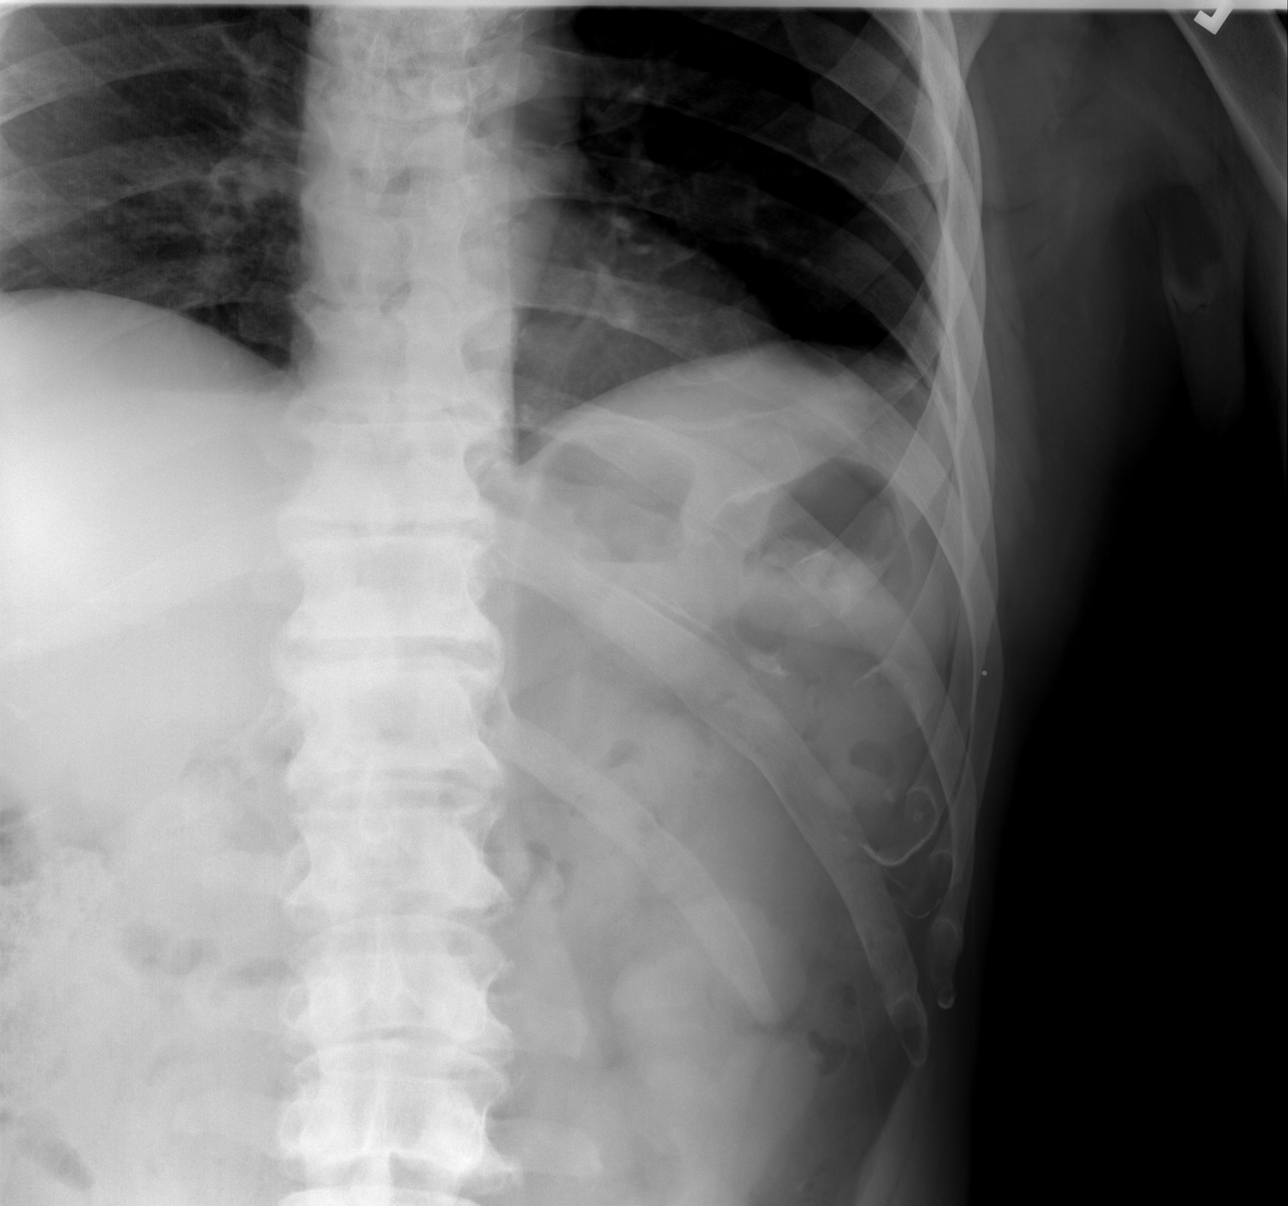

[w ribs oblique left]
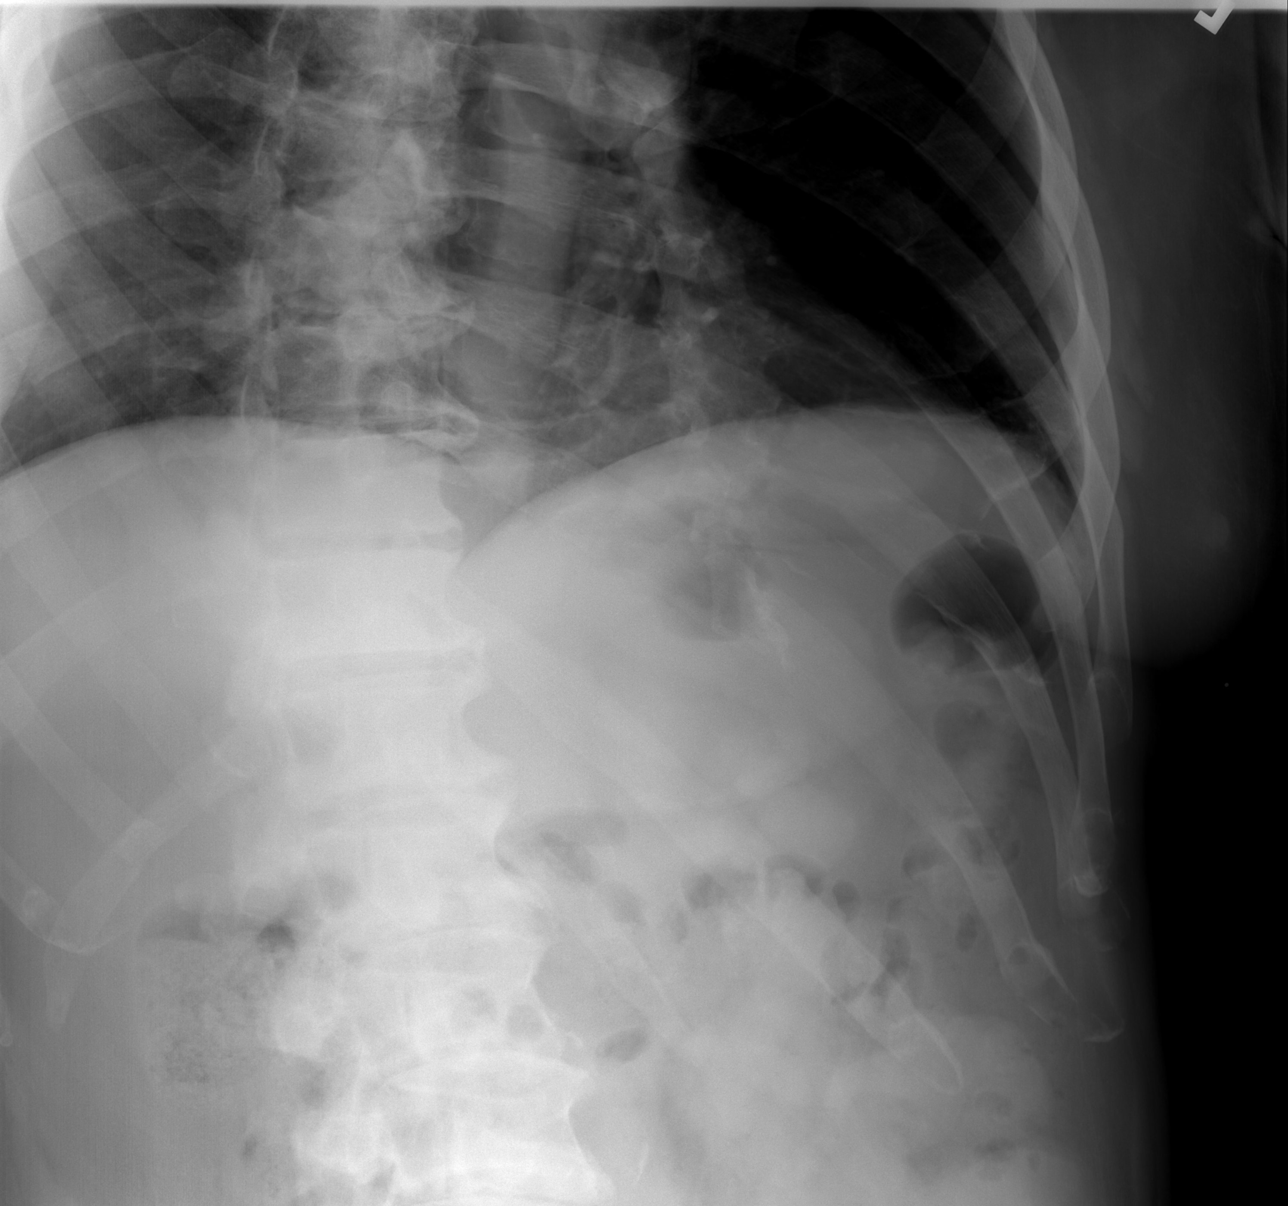

[3 of 3 positions shown; findings below may reference images not displayed]

FINDINGS: No fracture or other bone lesions are seen involving the ribs. There
is no evidence of pneumothorax or pleural effusion. Both lungs are
clear. Heart size and mediastinal contours are within normal limits.
IMPRESSION: Negative.

## 2023-10-28 NOTE — Progress Notes (Signed)
 Cardiology Office Note   Date:  11/08/2023   ID:  Samuel Jimenez, DOB 19-Aug-1950, MRN [id number]  PCP:  Clarice Nottingham, MD  Cardiologist:   Deannie Resetar [place], MD   Chief Complaint  Patient presents with   Coronary Artery Disease      History of Present Illness: Samuel Jimenez is a 73 y.o. male who is seen for follow up of coronary atherosclerosis noted on CT. He is a former smoker. CT showed calcification of the aorta as well as the coronary arteries- particularly the left main and LAD distribution. He has a history of HTN, DM type 2,  and HLD. Myoview  done 01/09/20 was normal.   He denies any prior cardiac disease. He states he was evaluated by a Cardiologist in [place] several years ago and had a stress test.   He is  sedentary.  Reports myalgias on statins in the past. Now on Praluent . He has DM for > 15 years and has some neuropathy.  He has a history of NASH with cirrhosis, portal HTN and esophageal varices.   He had a Myoview  in June 2024 showing normal perfusion and normal EF  He states he is doing well. No chest pain or dyspnea. Hasn't been exercising as much. Notes occasional RUQ discomfort. No bleeding. No ascites or edema. Has lost 20 lbs.     Past Medical History:  Diagnosis Date   Adrenal tumor    Aortic atherosclerosis    Colon polyp    Diabetes mellitus without complication (HCC)    ED (erectile dysfunction)    Hyperlipidemia    Hypertension    NASH (nonalcoholic steatohepatitis)    Pulmonary nodules    Splenomegaly     Past Surgical History:  Procedure Laterality Date   KNEE SURGERY     MELANOMA EXCISION       Current Outpatient Medications  Medication Sig Dispense Refill   Alirocumab  (PRALUENT ) 150 MG/ML SOAJ INJECT 150 MG INTO THE SKIN EVERY 14 (FOURTEEN) DAYS. 2 mL 0   ALPRAZolam (XANAX) 0.5 MG tablet Take 0.5 mg by mouth 2 (two) times daily as needed.     aspirin 81 MG EC tablet Take 81 mg by mouth daily. Swallow whole.     ketoconazole  (NIZORAL) 2 % cream SMARTSIG:1 Topical Every Night     losartan-hydrochlorothiazide (HYZAAR) 50-12.5 MG tablet Take 1 tablet by mouth daily.     LUMIGAN 0.01 % SOLN      metFORMIN (GLUCOPHAGE-XR) 500 MG 24 hr tablet Take 500 mg by mouth 3 (three) times daily.     Multiple Vitamins-Minerals (MULTIVITAMIN WITH MINERALS) tablet Take 1 tablet by mouth daily.     Semaglutide, 1 MG/DOSE, (OZEMPIC, 1 MG/DOSE,) 4 MG/3ML SOPN INJECT 1 MG SUBCUTANEOUS WEEKLY 28 DAYS     dorzolamide (TRUSOPT) 2 % ophthalmic solution Place 1 drop into both eyes 2 (two) times daily.     traZODone (DESYREL) 50 MG tablet Take 50 mg by mouth daily.     No current facility-administered medications for this visit.    Allergies:   Crestor  [rosuvastatin ] and Lipitor [atorvastatin]    Social History:  The patient  reports that he quit smoking about 7 years ago. His smoking use included cigarettes. He has never used smokeless tobacco. He reports that he does not currently use alcohol. He reports that he does not currently use drugs.   Family History:  The patient's family history includes Heart attack in his father; Lung cancer in his mother.  ROS:  Please see the history of present illness.   Otherwise, review of systems are positive for none.   All other systems are reviewed and negative.    PHYSICAL EXAM: VS:  BP 132/78 (BP Location: Left Arm, Patient Position: Sitting)   Pulse 65   Ht 6' 4 (1.93 m)   Wt 260 lb 6.4 oz (118.1 kg)   SpO2 98%   BMI 31.70 kg/m  , BMI Body mass index is 31.7 kg/m. GEN: Well nourished, overweight, in no acute distress  HEENT: normal  Neck: no JVD, carotid bruits, or masses Cardiac: RRR; no murmurs, rubs, or gallops,no edema. Pedal pulses are 2+.   Respiratory:  clear to auscultation bilaterally, normal work of breathing GI: soft, nontender, nondistended, + BS MS: no deformity or atrophy  Skin: warm and dry, no rash Neuro:  Strength and sensation are intact Psych: euthymic mood,  full affect         Recent Labs: No results found for requested labs within last 365 days.    Lipid Panel    Component Value Date/Time   CHOL 121 07/09/2020 0841   TRIG 247 (H) 07/09/2020 0841   HDL 43 07/09/2020 0841   CHOLHDL 2.8 07/09/2020 0841   LDLCALC 40 07/09/2020 0841     Dated 07/01/19: A1c 6.4% Dated 09/24/19: triglycerides 220, HDL 41, LDL 108. CBC normal. Glucose 155, Creatinine 1.0. otherwise CMET normal. a1c 6.9%  Dated 03/24/20: CBC normal. Cholesterol 169, triglycerides 217, HDL 42, LDL 84. A1c 6.4%. CMET normal. PSA normal.  Daetd 05/30/23: glucose 131, A1c 6.4%. otherwise CBC, UA, CMET normal. Cholesterol 139, triglycerides 253, HLD 40, LDL 59  Wt Readings from Last 3 Encounters:  11/08/23 260 lb 6.4 oz (118.1 kg)  07/11/22 260 lb (117.9 kg)  07/11/22 260 lb (117.9 kg)      Other studies Reviewed: Additional studies/ records that were reviewed today include:   CT CHEST WITHOUT CONTRAST   TECHNIQUE: Multidetector CT imaging of the chest was performed following the standard protocol without IV contrast.   COMPARISON:  None   FINDINGS: Cardiovascular: Normal heart size. No pericardial effusion. Aortic atherosclerosis. Coronary artery calcifications.   Mediastinum/Nodes: No enlarged mediastinal or axillary lymph nodes. Thyroid gland, trachea, and esophagus demonstrate no significant findings.   Lungs/Pleura: No pleural effusion, airspace consolidation or atelectasis. Tiny right upper lobe lung nodule measures 3 mm, image 82/3. 5 mm right lower lobe lung nodule is identified, image 119/3. Faint nodule in the left lower lobe measures 3 mm, image 98/3. None   Upper Abdomen: The liver has a nodular contour and there is relative hypertrophy of the caudate and lateral segment of left lobe of liver. Imaging findings are consistent with cirrhosis. Right adrenal gland adenoma measures 0.8 cm.   Musculoskeletal: Multi level spondylosis identified within  the thoracic spine. There are no acute or suspicious osseous findings identified. Chest   IMPRESSION: 1. No acute cardiopulmonary abnormalities. 2. Small nonspecific pulmonary nodules are identified measuring up to 5 mm. No follow-up needed if patient is low-risk (and has no known or suspected primary neoplasm). Non-contrast chest CT can be considered in 12 months if patient is high-risk. This recommendation follows the consensus statement: Guidelines for Management of Incidental Pulmonary Nodules Detected on CT Images: From the Fleischner Society 2017; Radiology 2017; 284:228-243. 3. Coronary artery calcifications noted. 4. Morphologic features of the liver consistent with cirrhosis. 5. Right adrenal gland adenoma.   Aortic Atherosclerosis (ICD10-I70.0).     Electronically Signed  By: Waddell Calk M.D.   On: 10/20/2019 21:03   Myoview  01/09/20: Study Highlights    Nuclear stress EF: 51%. The left ventricular ejection fraction is mildly decreased (45-54%). Blood pressure demonstrated a hypertensive response to exercise. There was no ST segment deviation noted during stress. The study is normal. This is a low risk study.   Normal resting and stress perfusion. No ischemia or infarction EF 51% but appears normal Baseline ECG RBBB HTN response to exercise Normal ETT with no ischemia   Myoview  07/11/22: Study Highlights Show Result Comparison    The study is normal. The study is low risk.   No ST deviation was noted.   LV perfusion is normal. There is no evidence of ischemia. There is no evidence of infarction.   Left ventricular function is normal. Nuclear stress EF: 59 %. The left ventricular ejection fraction is normal (55-65%). End diastolic cavity size is normal. End systolic cavity size is normal.   Normal exercise myovue Baseline ECG with RBBB no ST changes with exercise HTN response  Normal perfusion images EF 59%     ASSESSMENT AND PLAN:  1. Coronary artery  calcification. Noted incidentally on CT. Severe calcification of left main and LAD. Asymptomatic. Multiple cardiac risk factors. Stress Myoview  was fortunately normal last year. Focus on risk factor modification. Encouraged increase aerobic activity. 2. Aortic atherosclerosis. 3. HLD. On  Praluent . LDL is at goal. Intolerant to statins 4. HTN controlled 5. DM type 2 with  peripheral neuropathy 6. NASH with cirrhosis.  7. Former smoker. 8. Family history of CAD.  Current medicines are reviewed at length with the patient today.  The patient does not have concerns regarding medicines.  The following changes have been made:  See above  Labs/ tests ordered today include:   Orders Placed This Encounter  Procedures   EKG 12-Lead     Disposition:   FU with me one year   Signed, Torri Michalski [place], MD  11/08/2023 8:22 AM    [facility] [address] [place], [place], [address] Phone [phone], Fax [phone]

## 2023-11-08 ENCOUNTER — Encounter: Payer: Self-pay | Admitting: Cardiology

## 2023-11-08 ENCOUNTER — Ambulatory Visit: Attending: Cardiology | Admitting: Cardiology

## 2023-11-08 VITALS — BP 132/78 | HR 65 | Ht 76.0 in | Wt 260.4 lb

## 2023-11-08 DIAGNOSIS — I2584 Coronary atherosclerosis due to calcified coronary lesion: Secondary | ICD-10-CM | POA: Diagnosis not present

## 2023-11-08 DIAGNOSIS — R0609 Other forms of dyspnea: Secondary | ICD-10-CM | POA: Insufficient documentation

## 2023-11-08 DIAGNOSIS — I2510 Atherosclerotic heart disease of native coronary artery without angina pectoris: Secondary | ICD-10-CM | POA: Insufficient documentation

## 2023-11-08 DIAGNOSIS — E785 Hyperlipidemia, unspecified: Secondary | ICD-10-CM | POA: Insufficient documentation

## 2023-11-08 NOTE — Patient Instructions (Addendum)
